# Patient Record
Sex: Female | Born: 1970 | Race: Asian | Hispanic: No | Marital: Single
Health system: Midwestern US, Community
[De-identification: ages and names within clinical notes are randomized; demographics above are authoritative.]

## PROBLEM LIST (undated history)

## (undated) DIAGNOSIS — N920 Excessive and frequent menstruation with regular cycle: Secondary | ICD-10-CM

## (undated) DIAGNOSIS — E785 Hyperlipidemia, unspecified: Secondary | ICD-10-CM

## (undated) DIAGNOSIS — T7840XA Allergy, unspecified, initial encounter: Secondary | ICD-10-CM

## (undated) DIAGNOSIS — D649 Anemia, unspecified: Secondary | ICD-10-CM

## (undated) DIAGNOSIS — E079 Disorder of thyroid, unspecified: Secondary | ICD-10-CM

## (undated) DIAGNOSIS — Z973 Presence of spectacles and contact lenses: Secondary | ICD-10-CM

## (undated) DIAGNOSIS — E119 Type 2 diabetes mellitus without complications: Secondary | ICD-10-CM

## (undated) DIAGNOSIS — Z9109 Other allergy status, other than to drugs and biological substances: Secondary | ICD-10-CM

## (undated) DIAGNOSIS — R002 Palpitations: Secondary | ICD-10-CM

## (undated) DIAGNOSIS — R42 Dizziness and giddiness: Secondary | ICD-10-CM

## (undated) DIAGNOSIS — D509 Iron deficiency anemia, unspecified: Secondary | ICD-10-CM

## (undated) DIAGNOSIS — N840 Polyp of corpus uteri: Secondary | ICD-10-CM

## (undated) DIAGNOSIS — I10 Essential (primary) hypertension: Secondary | ICD-10-CM

## (undated) DIAGNOSIS — K769 Liver disease, unspecified: Secondary | ICD-10-CM

## (undated) HISTORY — DX: Allergy, unspecified, initial encounter: T78.40XA

## (undated) HISTORY — DX: Hyperlipidemia, unspecified: E78.5

## (undated) HISTORY — PX: DILATION AND CURETTAGE OF UTERUS: SHX78

## (undated) HISTORY — DX: Excessive and frequent menstruation with regular cycle: N92.0

---

## 2007-08-19 DIAGNOSIS — E0500 Thyrotoxicosis with diffuse goiter without thyrotoxic crisis or storm: Secondary | ICD-10-CM

## 2007-08-19 HISTORY — DX: Thyrotoxicosis with diffuse goiter without thyrotoxic crisis or storm: E05.00

## 2013-08-18 DIAGNOSIS — Z9289 Personal history of other medical treatment: Secondary | ICD-10-CM

## 2013-08-18 HISTORY — DX: Personal history of other medical treatment: Z92.89

## 2014-08-18 HISTORY — PX: HYSTEROSCOPY WITH D & C: SHX1775

## 2015-11-20 LAB — AMB EXT LIPID PANEL
HDL, External: 73
LDL-C, External: 90
Total Cholesterol, External: 176
Triglycerides, External: 66

## 2015-11-20 LAB — AMB EXT HGBA1C: Hemoglobin A1c, External: 6.4

## 2016-06-04 LAB — AMB EXT TSH: TSH, EXTERNAL: 2.28

## 2016-06-04 LAB — AMB EXT HGBA1C: Hemoglobin A1c, External: 6

## 2016-06-04 LAB — AMB EXT HCT: Hct, External: 37.1

## 2016-08-01 NOTE — Telephone Encounter (Signed)
Patient Identity Verified with 2 Methods        Call Type    Admin/Sched   PRIORITY Normal   Initial Call Started: Madison Kelley,  May 20, 2016 9:52 AM   Spoke to: Patient   Rescheduled/Cancelled Appt Comment [facility]. She would also like a TSH to be added to the orders. Please mail order directly to patient.    Initial call taken by: Madison Kelley,  May 20, 2016 9:52 AM         Follow-up for Phone Call    Follow-up Details: OK to add TSH order?   Follow-up by: Adam PhenixKimberly Durrell CMA AAMA,  May 20, 2016 9:53 AM      Additional Follow-up for Phone Call    Additional Follow-up Details: mailed   Additional Follow-up by: Adam PhenixKimberly Durrell CMA AAMA,  May 20, 2016 12:12 PM         New Orders:   TSH [[id number]]         Electronically signed by Adam PhenixKimberly Durrell CMA AAMA on 05/20/2016 at 12:12 PM   ________________________________________________________________________

## 2016-08-01 NOTE — Telephone Encounter (Signed)
Call Type    Referrals   PRIORITY Normal   Coordination of Care:       Coordination of Care: Referral made for PT on 10/11/14.  Following note was left w/order:       rec'd fax from [facility] "pt has not returned our call to schedule" the 11/02/14 office visit says thew pt may be out of the country [place] months. Rodman CompBeyenberg, Amanda  November 06, 2014 8:16 AM    Ref Started:October 16, 2014       This order is over a year old.  Is it still to be pursued?   Initial call taken by: Marlowe AltSherri Cochran,  June 03, 2016 3:23 PM         Follow-up for Phone Call    Follow-up Details: ok to cancel   Follow-up by: Zorita PangKathy L Bouton Semmel, PA-C,  June 03, 2016 5:40 PM            Electronically signed by Marlowe AltSherri Cochran on 06/04/2016 at 8:04 AM   ________________________________________________________________________

## 2016-09-30 MED ORDER — METFORMIN ER 500 MG 24 HR TABLET,EXTENDED RELEASE
500 mg | ORAL_TABLET | Freq: Two times a day (BID) | ORAL | 0 refills | Status: DC
Start: 2016-09-30 — End: 2017-03-16

## 2016-09-30 NOTE — Telephone Encounter (Signed)
Patient in office requesting refill of Metformin. She has 2 months worth but needs one more month because [place] is going to [place]. She uses [facility]. She leaves on 10/14/16.

## 2016-09-30 NOTE — Telephone Encounter (Signed)
Madison Kelley, ok to do?

## 2016-09-30 NOTE — Telephone Encounter (Signed)
Yes and please do a 90 day supply- double check dose in centricity

## 2017-03-16 ENCOUNTER — Telehealth

## 2017-03-16 MED ORDER — METFORMIN SR 500 MG 24 HR TABLET
500 mg | ORAL_TABLET | Freq: Two times a day (BID) | ORAL | 0 refills | Status: DC
Start: 2017-03-16 — End: 2017-06-09

## 2017-03-16 NOTE — Telephone Encounter (Signed)
Madison Kelley is moving out of [place] on Aug 23 and was looking to get her PE done before she moves.  These are her days off.Marland Kitchen.Marland Kitchen.Marland Kitchen.Aug 1 and 2 she is off work and the 6th, 7, 9th and 10, 14 - 16 are days she is off and could come in for a PE. She also wants labs done and would like to come in and pick up the orders tomorrow or soon after.Please give her a call when they are ready to be picked up    I was unable to schedule due to space limits on scheduling PEs.  Please advise.     She also needs a refill on her Metformin ER.  She would like to get a 90 day supply.

## 2017-03-16 NOTE — Telephone Encounter (Signed)
I have filled the metformin.     Please advise on annual PE and lab orders. Is it OK to use a non- physical spot?

## 2017-03-17 NOTE — Telephone Encounter (Signed)
He has any spot please-CBC diagnosis iron anemia, CMP lipids hemoglobin A1c urine micro diagnosis type 2 diabetes-TSH can also be used for type 2 diabetes

## 2017-03-18 ENCOUNTER — Encounter

## 2017-03-18 LAB — HM DIABETES EYE EXAM

## 2017-03-18 NOTE — Telephone Encounter (Signed)
Victorino DikeJennifer spoke with pt. And scheduled her for PE. [facility] is [facility]. She is coming to pick up orders, they have been printed.

## 2017-03-18 NOTE — Telephone Encounter (Signed)
I added lab order, however I don't know which lab she's planning on going to. [facility] to [facility]. I left her a detailed message asking for a call back to schedule PE.

## 2017-03-18 NOTE — Telephone Encounter (Signed)
Spoke to patient and scheduled her for 8/10. She is going to stop by the office and pick up labs today or tomorrow. [facility].

## 2017-03-19 LAB — DIFFERENTIAL, AUTO.
ABS. BASOPHILS: 0.09 10*3/uL (ref 0.00–0.20)
ABS. BASOPHILS: 0.09 10*3/uL (ref 0.00–0.20)
ABS. IMM. GRANS.: 0.01 10*3/uL (ref 0.00–0.03)
ABS. IMM. GRANS.: 0.01 10*3/uL (ref 0.00–0.03)
ABS. MONOCYTES: 0.3 10*3/uL (ref 0.10–0.80)
ABS. MONOCYTES: 0.3 10*3/uL (ref 0.10–0.80)
ABS. NEUTROPHILS: 3.19 10*3/uL (ref 1.90–7.80)
ABS. NEUTROPHILS: 3.19 10*3/uL (ref 1.90–7.80)
Abs Lymphocytes: 1.56 10*3/uL (ref 1.00–4.50)
Abs Lymphocytes: 1.56 10*3/uL (ref 1.00–4.50)
BASOPHILS: 1.7 %
BASOPHILS: 1.7 %
BRCH EOSINS: 1.2 %
BRCH EOSINS: 1.2 %
BRCH NEUTROPHIL: 61.2 %
BRCH NEUTROPHIL: 61.2 %
Eos abs-DIF: 0.06 10*3/uL (ref 0.00–0.50)
Eos abs-DIF: 0.06 10*3/uL (ref 0.00–0.50)
IMMATURE GRANULOCYTES: 0.2 %
IMMATURE GRANULOCYTES: 0.2 %
LYMPHOCYTES: 29.9 %
LYMPHOCYTES: 29.9 %
MONOCYTES: 5.8 %
MONOCYTES: 5.8 %

## 2017-03-19 LAB — CBC WITH AUTOMATED DIFF
HCT: 34.2 % — ABNORMAL LOW (ref 36.0–47.0)
HGB: 10.6 g/dL — ABNORMAL LOW (ref 12.0–16.0)
MCH: 24.9 pg — ABNORMAL LOW (ref 28.0–34.0)
MCHC: 31 g/dL — ABNORMAL LOW (ref 32.0–36.0)
MCV: 80.5 fL (ref 80.0–100.0)
MEAN PLATELET VOLUME: 9 fL (ref 8.5–12.0)
PLATELET: 471 10*3/uL — ABNORMAL HIGH (ref 150–400)
RBC: 4.25 Mil/uL (ref 4.00–5.40)
RDW-CV: 14.8 % — ABNORMAL HIGH (ref 11.5–13.6)
RDW-SD: 43.3 fL (ref 35.0–47.0)
WBC: 5.2 10*3/uL (ref 4.7–10.8)

## 2017-03-19 LAB — MICROALBUMIN, UR, RAND
CREATININE UR: 0.49 g/L
Creatinine conc., random urine: 49.3 mg/dL
Microalbumin urine, mg/L: <12 mg/L (ref 0.0–19.9)

## 2017-03-19 LAB — METABOLIC PANEL, COMPREHENSIVE
ALT (SGPT): 9 IU/L (ref 0–33)
AST (SGOT): 15 IU/L (ref 0–32)
Albumin: 4.3 g/dL (ref 3.5–5.2)
Alk. phosphatase: 55 IU/L (ref 35–104)
Anion gap: 12 mEq/L (ref 3–16)
BUN: 12 mg/dL (ref 6–20)
Bilirubin, total: 0.4 mg/dL (ref 0.0–1.0)
CO2: 27 mEq/L (ref 22–32)
Calcium: 9.1 mg/dL (ref 8.8–10.3)
Chloride: 103 mEq/L (ref 98–107)
Creatinine: 0.63 mg/dL (ref 0.40–1.10)
Glucose: 95 mg/dL (ref 70–120)
Potassium: 4.2 mEq/L (ref 3.5–5.0)
Protein, total: 6.9 g/dL (ref 6.1–7.9)
Sodium: 142 mEq/L (ref 136–145)
eGFR: >60

## 2017-03-19 LAB — LIPID PANEL W/ REFLX DIRECT LDL
Cholesterol, total: 160 mg/dL — ABNORMAL LOW (ref 165–199)
HDL Cholesterol: 70 mg/dL (ref 40–91)
LDL CHOL, CALCULATED: 78 mg/dL (ref 70–129)
LDL+VLDL: 90 mg/dL (ref <=160)
Triglyceride: 63 mg/dL (ref 50–149)

## 2017-03-19 LAB — HEMOGLOBIN A1C WITH EAG
Est. average glucose: 128 mg/dL
Hemoglobin A1c: 6.1 % — ABNORMAL HIGH (ref 4.8–5.6)

## 2017-03-19 LAB — TSH REFLEX TO T4F
TSH: 0.57 u[IU]/mL (ref 0.40–3.80)
TSH: 0.57 u[IU]/mL (ref 0.40–3.80)

## 2017-03-19 LAB — HEPATIC FUNCTION PANEL
ALK PHOS: 55 (ref 25–125)
ALT: 9 (ref 7–35)
AST: 15 (ref 13–35)
Bilirubin, Total: 0.4

## 2017-03-19 LAB — BASIC METABOLIC PANEL
BUN: 12 (ref 4–21)
Creatinine: 0.6 (ref 0.5–1.1)
GLUCOSE: 95
Potassium: 4.2 (ref 3.4–5.3)
Sodium: 142 (ref 137–147)

## 2017-03-19 LAB — TSH: TSH: 0.57 (ref 0.41–5.90)

## 2017-03-19 LAB — LIPID PANEL
Cholesterol: 160 (ref 0–200)
HDL: 70 (ref 35–70)
LDL CALC: 78
Triglycerides: 63 (ref 40–160)

## 2017-03-24 ENCOUNTER — Encounter

## 2017-03-27 ENCOUNTER — Ambulatory Visit: Admit: 2017-03-27 | Discharge: 2017-03-27 | Payer: PRIVATE HEALTH INSURANCE | Attending: Physician Assistant

## 2017-03-27 DIAGNOSIS — Z0000 Encounter for general adult medical examination without abnormal findings: Secondary | ICD-10-CM

## 2017-03-27 NOTE — Patient Instructions (Signed)
Your numbers are good, except your iron is low- please restart the ferrous sulfate , take daily with a small glass of orange juice

## 2017-03-27 NOTE — Progress Notes (Signed)
[facility]   [place] [id number]  [phone]    CHIEF COMPLAINT   Madison Kelley is a 46 y.o. female who presents to clinic for Physical.    HPI   1.  Annual physical-[place] family there.  Works as a Marine scientist, nonsmoker, no regular exercise. Single, G0P0.  Will be moving in a few weeks to [place], with the family that she has been living with in friends with for years.  She feels she will have no trouble finding a job there.  She does see Dr. in [place] for her gyn care, she did have a D&C in the fall of 2016.  She has a history of anemia, the and is no longer on her iron.   She is current with her Tdap and gets annual flu shots.  She does have type 2 diabetes and takes metformin.  She has a history of Graves disease, still follows with Endocrine      Patient Active Problem List   Diagnosis Code   ??? 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 Anemia, iron deficiency D50.9   ??? Neck pain M54.2   ??? 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 Diabetes mellitus, type II ([place]) E11.9   ??? Intermittent chest pain R07.9   ??? Vitamin D deficiency E55.9   ??? Vertigo R42   ??? Graves' disease E05.00   ??? Annual physical exam Z00.00     Past Medical History:   Diagnosis Date   ??? Diabetes ([place]) 2008    Type 2 no complications   ??? History of prior pregnancies     G0P0 periods 4 per year OBGYN ok with this- Dr. Bridgette Habermann   ??? Hyperthyroidism      Past Surgical History:   Procedure Laterality Date   ??? HX DILATION AND CURETTAGE  2016    Fall 2016     Family History   Problem Relation Age of Onset   ??? Diabetes Mother      Type 2    ??? Cancer Mother      Cervical   ??? Coronary Artery Disease Mother    ??? Dementia Father      Healthy, age 41   ??? Diabetes Brother      Type 2   ??? Thyroid Cancer Maternal Aunt    ??? Diabetes Maternal Aunt    ??? Other Maternal Aunt      Thyroid Disease ?   ??? Diabetes Brother      Type 2     Social History     Social History   ??? Marital status: SINGLE     Spouse name: N/A    ??? Number of children: N/A   ??? Years of education: N/A     Occupational History   ??? Not on file.     Social History Main Topics   ??? Smoking status: Never Smoker   ??? Smokeless tobacco: Never Used   ??? Alcohol use Not on file   ??? Drug use: Not on file   ??? Sexual activity: Not on file     Other Topics Concern   ??? Not on file     Social History Narrative    Single    No children    [employer]-nurse-works as a float-nights    Back in school to get her masters    Patient has never smoked.    Regular Exercise-no  No Known Allergies    Current Outpatient Prescriptions on File Prior to Visit   Medication Sig Dispense Refill   ??? FREESTYLE LANCETS daily. Freestyle Lancets Misc. Lite: For daily testing DX: E11.9     ??? blood-glucose meter (FREESTYLE LITE METER) daily. Use Daily as directed DX E11.9     ??? glucose blood VI test strips (FREESTYLE LITE STRIPS) strip daily. Test daily. DX E11.9     ??? metFORMIN ER (GLUCOPHAGE XR) 500 mg tablet Take 1 Tab by mouth two (2) times a day. 180 Tab 0     No current facility-administered medications on file prior to visit.      There are no discontinued medications.          REVIEW OF SYSTEMS   Review of Systems   Constitutional: Negative for chills and fever.   HENT: Negative for congestion and sore throat.    Eyes: Negative for blurred vision and double vision.   Respiratory: Negative for cough, shortness of breath and wheezing.    Cardiovascular: Negative for chest pain and palpitations.   Gastrointestinal: Negative for blood in stool, constipation, heartburn, nausea and vomiting.   Genitourinary: Negative for dysuria.        See HPI   Musculoskeletal: Negative for back pain and neck pain.   Skin: Negative for rash.   Neurological: Negative for dizziness and headaches.   Endo/Heme/Allergies: Does not bruise/bleed easily.   Psychiatric/Behavioral: Negative for depression. The patient is not nervous/anxious and does not have insomnia.        PHYSICAL EXAM     Visit Vitals    ??? BP 120/82 (BP 1 Location: Right arm, BP Patient Position: Sitting)   ??? Pulse 72   ??? Temp 98.3 ??F (36.8 ??C) (Oral)   ??? Ht 5' 3.19" (1.605 m)   ??? Wt 130 lb 3.2 oz (59.1 kg)   ??? BMI 22.93 kg/m2       Physical Exam   Constitutional: She is oriented to person, place, and time and well-developed, well-nourished, and in no distress.   HENT:   Head: Normocephalic.   Right Ear: External ear normal.   Left Ear: External ear normal.   Mouth/Throat: Oropharynx is clear and moist.   Glasses   Eyes: Conjunctivae and EOM are normal. Pupils are equal, round, and reactive to light.   Neck: Neck supple. No thyromegaly present.   Cardiovascular: Normal rate, regular rhythm, normal heart sounds and intact distal pulses.    Pulmonary/Chest: Effort normal and breath sounds normal.   Abdominal: Bowel sounds are normal. She exhibits no distension. There is no tenderness.   Musculoskeletal: Normal range of motion. She exhibits no edema or deformity.   Lymphadenopathy:     She has no cervical adenopathy.   Neurological: She is alert and oriented to person, place, and time. She has normal reflexes.   Psychiatric: Memory and affect normal.   Vitals reviewed.      LABS/IMAGING     No results found for any visits on 03/27/17.  Lab Results   Component Value Date/Time    Sodium 142 03/19/2017 09:42 AM    Potassium 4.2 03/19/2017 09:42 AM    Chloride 103 03/19/2017 09:42 AM    CO2 27 03/19/2017 09:42 AM    Anion gap 12 03/19/2017 09:42 AM    Glucose 95 03/19/2017 09:42 AM    BUN 12 03/19/2017 09:42 AM    Creatinine 0.63 03/19/2017 09:42 AM    Calcium 9.1 03/19/2017 09:42  AM    Bilirubin, total 0.4 03/19/2017 09:42 AM    AST (SGOT) 15 03/19/2017 09:42 AM    Alk. phosphatase 55 03/19/2017 09:42 AM    Protein, total 6.9 03/19/2017 09:42 AM    Albumin 4.3 03/19/2017 09:42 AM    ALT (SGPT) 9 03/19/2017 09:42 AM     Lab Results   Component Value Date/Time    WBC 5.2 03/19/2017 09:42 AM    HGB 10.6 (L) 03/19/2017 09:42 AM     HCT 34.2 (L) 03/19/2017 09:42 AM    PLATELET 471 (H) 03/19/2017 09:42 AM    MCV 80.5 03/19/2017 09:42 AM    Hct, External 37.1 06/04/2016     Lab Results   Component Value Date/Time    Hemoglobin A1c 6.1 (H) 03/19/2017 09:42 AM    Hemoglobin A1c, External 6.0 06/04/2016     Lab Results   Component Value Date/Time    TSH 0.57 03/19/2017 09:42 AM    Tsh, External 2.28 06/04/2016     Lab Results   Component Value Date/Time    Cholesterol, total 160 (L) 03/19/2017 09:42 AM    HDL Cholesterol 70 03/19/2017 09:42 AM    LDL CHOL, CALCULATED 78 03/19/2017 09:42 AM    Triglyceride 63 03/19/2017 09:42 AM             PREVENTIVE CARE     Immunization History   Administered Date(s) Administered   ??? Influenza Vaccine 06/20/2015   ??? Td 02/08/1998   ??? Tdap 03/29/2013   ??? Zoster Vaccine, Live 02/26/2004               ASSESSMENT'PLAN   Diagnoses and all orders for this visit:    1. Annual physical exam-no change in meds-encouraged patient to start an exercise regimen, even if it is walking.  The she also will restart her iron supplementation, remembering to take it with a glass of orange juice daily.  She will find a PCP when she was [place] and make sure she has a follow-up down there.            Follow-up Disposition:  Return pt moving this month to [place].  No future appointments.    [place], PA-C  03/27/2017

## 2017-06-09 MED ORDER — METFORMIN SR 500 MG 24 HR TABLET
500 mg | ORAL_TABLET | Freq: Two times a day (BID) | ORAL | 0 refills | Status: DC
Start: 2017-06-09 — End: 2017-09-28

## 2017-06-09 NOTE — Telephone Encounter (Signed)
RX sent to pharmacy she requested

## 2017-06-09 NOTE — Telephone Encounter (Signed)
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

## 2017-06-09 NOTE — Telephone Encounter (Signed)
Rcv'd a voicemail from pt. Requesting a refill on metformin 500 mg er 1 po [facility]     Pt. Has moved there, but I don't see a ROI in pt's chart showing she found a new PCP yet? Is it OK to fill or?

## 2017-07-29 ENCOUNTER — Encounter (HOSPITAL_COMMUNITY): Payer: Self-pay

## 2017-07-29 ENCOUNTER — Emergency Department (HOSPITAL_COMMUNITY)
Admission: EM | Admit: 2017-07-29 | Discharge: 2017-07-29 | Disposition: A | Discharge status: Home / Self Care | Payer: 59 | Attending: Emergency Medicine | Admitting: Emergency Medicine

## 2017-07-29 ENCOUNTER — Other Ambulatory Visit: Payer: Self-pay

## 2017-07-29 DIAGNOSIS — E119 Type 2 diabetes mellitus without complications: Secondary | ICD-10-CM | POA: Insufficient documentation

## 2017-07-29 DIAGNOSIS — R9431 Abnormal electrocardiogram [ECG] [EKG]: Secondary | ICD-10-CM | POA: Diagnosis not present

## 2017-07-29 DIAGNOSIS — R42 Dizziness and giddiness: Secondary | ICD-10-CM | POA: Insufficient documentation

## 2017-07-29 DIAGNOSIS — Z7984 Long term (current) use of oral hypoglycemic drugs: Secondary | ICD-10-CM | POA: Insufficient documentation

## 2017-07-29 HISTORY — DX: Anemia, unspecified: D64.9

## 2017-07-29 HISTORY — DX: Disorder of thyroid, unspecified: E07.9

## 2017-07-29 HISTORY — DX: Type 2 diabetes mellitus without complications: E11.9

## 2017-07-29 LAB — BASIC METABOLIC PANEL
Anion gap: 8 (ref 5–15)
BUN: 7 mg/dL (ref 6–20)
CO2: 24 mmol/L (ref 22–32)
CREATININE: 0.54 mg/dL (ref 0.44–1.00)
Calcium: 8.6 mg/dL — ABNORMAL LOW (ref 8.9–10.3)
Chloride: 102 mmol/L (ref 101–111)
GFR calc Af Amer: >60 mL/min (ref >60)
GFR calc non Af Amer: >60 mL/min (ref >60)
GLUCOSE: 141 mg/dL — AB (ref 65–99)
Potassium: 3.6 mmol/L (ref 3.5–5.1)
SODIUM: 134 mmol/L — AB (ref 135–145)

## 2017-07-29 LAB — CBG MONITORING, ED: GLUCOSE-CAPILLARY: 134 mg/dL — AB (ref 65–99)

## 2017-07-29 LAB — URINALYSIS, ROUTINE W REFLEX MICROSCOPIC
BILIRUBIN URINE: NEGATIVE
Glucose, UA: NEGATIVE mg/dL
Hgb urine dipstick: NEGATIVE
KETONES UR: 20 mg/dL — AB
Nitrite: NEGATIVE
Protein, ur: NEGATIVE mg/dL
SPECIFIC GRAVITY, URINE: 1.014 (ref 1.005–1.030)
pH: 9 — ABNORMAL HIGH (ref 5.0–8.0)

## 2017-07-29 LAB — CBC
HEMATOCRIT: 36.1 % (ref 36.0–46.0)
Hemoglobin: 11.4 g/dL — ABNORMAL LOW (ref 12.0–15.0)
MCH: 24.3 pg — AB (ref 26.0–34.0)
MCHC: 31.6 g/dL (ref 30.0–36.0)
MCV: 77 fL — AB (ref 78.0–100.0)
Platelets: 459 10*3/uL — ABNORMAL HIGH (ref 150–400)
RBC: 4.69 MIL/uL (ref 3.87–5.11)
RDW: 15.7 % — AB (ref 11.5–15.5)
WBC: 10.5 10*3/uL (ref 4.0–10.5)

## 2017-07-29 LAB — I-STAT BETA HCG BLOOD, ED (MC, WL, AP ONLY)

## 2017-07-29 MED ORDER — MECLIZINE HCL 25 MG PO TABS: 25.0000 mg | ORAL_TABLET | Freq: Three times a day (TID) | ORAL | 0 refills | Status: DC | PRN

## 2017-07-29 NOTE — Discharge Instructions (Signed)
If you do not have a primary care physician then it is very important that you develop a relationship with one.  Please contact [facility] at [phone] for a referral to many excellent primary care physicians in the community.   ° ° °

## 2017-07-29 NOTE — ED Triage Notes (Signed)
Pt states she woke up this morning with dizziness. She states hx of vertigo. Pt reports dizziness is worse with moving her head. Dizziness has also caused nausea and 3 episodes of emesis.

## 2017-07-29 NOTE — ED Notes (Signed)
Pt understood dc material. NAD noted. Script given at dc  

## 2017-07-29 NOTE — ED Notes (Signed)
Patient refusing cardiac monitoring at this time.

## 2017-07-29 NOTE — ED Triage Notes (Signed)
Patient reports feeling better and not wanting to wait anymore. RN encouraged patient to stay, patient agrees to stay so she can get her results.

## 2017-07-29 NOTE — ED Notes (Signed)
ED Provider at bedside. 

## 2017-07-29 NOTE — ED Provider Notes (Signed)
[facility] EMERGENCY DEPARTMENT Provider Note  CSN: [id number] Arrival date & time: 07/29/17 1513  Chief Complaint(s) Dizziness  HPI Kaitlin Bowman is a 46 y.o. 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  This was a bit different than her previous vertiginous symptoms, stating that before the symptoms were more severe.  She did report that her symptoms worsen with head movement.  Improved with tilting her head to the right.  She did have associated nausea and vomiting vertiginous symptoms.  Denies any recent fevers, infections, nasal congestion.  No head trauma.  No chest pain or shortness of breath.  No focal weakness.  Symptoms gradually improved over the past several hours and have now resolved.  HPI  Past Medical History Past Medical History:  Diagnosis Date  . Anemia   . Diabetes mellitus without complication (HCC)   . Thyroid disease    There are no active problems to display for this patient.  Home Medication(s) Prior to Admission medications   Medication Sig Start Date End Date Taking? Authorizing Provider  aspirin-acetaminophen-caffeine (EXCEDRIN MIGRAINE) [phone] MG tablet Take 1 tablet by mouth every 6 (six) hours as needed (for migraines).    Yes [provider]  metFORMIN (GLUCOPHAGE-XR) 500 MG 24 hr tablet Take 500 mg by mouth 2 (two) times daily with a meal.   Yes [provider]  meclizine (ANTIVERT) 25 MG tablet Take 1 tablet (25 mg total) by mouth 3 (three) times daily as needed for dizziness. 07/29/17   Nira Conn, MD                                                                                                                                    Past Surgical History History reviewed. No pertinent surgical history. Family History History reviewed. No pertinent family history.  Social History Social History   Tobacco Use  . Smoking status: Never  Smoker  . Smokeless tobacco: Never Used  Substance Use Topics  . Alcohol use: No    Frequency: Never  . Drug use: No   Allergies Mango flavor [flavoring agent] and Other  Review of Systems Review of Systems All other systems are reviewed and are negative for acute change except as noted in the HPI  Physical Exam Vital Signs  I have reviewed the triage vital signs BP 129/71 (BP Location: Right Arm)   Pulse 74   Temp 98.6 F (37 C)   Resp 16   SpO2 100%   Physical Exam  Constitutional: She is oriented to person, place, and time. She appears well-developed and well-nourished. No distress.  HENT:  Head: Normocephalic and atraumatic.  Right Ear: Tympanic membrane and external ear normal.  Left Ear: Tympanic membrane and external ear normal.  Nose: Nose normal.  Eyes: Conjunctivae and EOM are normal. No scleral icterus.  Neck: Normal range of motion and  phonation normal.  Cardiovascular: Normal rate and regular rhythm.  Pulmonary/Chest: Effort normal. No stridor. No respiratory distress.  Abdominal: She exhibits no distension.  Musculoskeletal: Normal range of motion. She exhibits no edema.  Neurological: She is alert and oriented to person, place, and time.  Mental Status:  Alert and oriented to person, place, and time.  Attention and concentration normal.  Speech clear.  Recent memory is intact  Cranial Nerves:  II Visual Fields: Intact to confrontation. Visual fields intact. III, IV, VI: Pupils equal and reactive to light and near. Full eye movement without nystagmus  V Facial Sensation: Normal. No weakness of masticatory muscles  VII: No facial weakness or asymmetry  VIII Auditory Acuity: Grossly normal  IX/X: The uvula is midline; the palate elevates symmetrically  XI: Normal sternocleidomastoid and trapezius strength  XII: The tongue is midline. No atrophy or fasciculations.   Motor System: Muscle Strength: 5/5 and symmetric in the upper and lower extremities. No  pronation or drift.  Muscle Tone: Tone and muscle bulk are normal in the upper and lower extremities.   Reflexes: DTRs: 1+ and symmetrical in all four extremities. No Clonus Coordination: Intact finger-to-nose, heel-to-shin. No tremor.  Sensation: Intact to light touch, and pinprick. Negative Romberg test.  Gait: Routine gait normal.   Skin: She is not diaphoretic.  Psychiatric: She has a normal mood and affect. Her behavior is normal.  Vitals reviewed.   ED Results and Treatments Labs (all labs ordered are listed, but only abnormal results are displayed) Labs Reviewed  BASIC METABOLIC PANEL - Abnormal; Notable for the following components:      Result Value   Sodium 134 (*)    Glucose, Bld 141 (*)    Calcium 8.6 (*)    All other components within normal limits  CBC - Abnormal; Notable for the following components:   Hemoglobin 11.4 (*)    MCV 77.0 (*)    MCH 24.3 (*)    RDW 15.7 (*)    Platelets 459 (*)    All other components within normal limits  URINALYSIS, ROUTINE W REFLEX MICROSCOPIC - Abnormal; Notable for the following components:   pH 9.0 (*)    Ketones, ur 20 (*)    Leukocytes, UA SMALL (*)    Bacteria, UA RARE (*)    Squamous Epithelial / LPF 0-5 (*)    All other components within normal limits  CBG MONITORING, ED - Abnormal; Notable for the following components:   Glucose-Capillary 134 (*)    All other components within normal limits  CBG MONITORING, ED  I-STAT BETA HCG BLOOD, ED ([facility], [facility], [facility] ONLY)                                                                                                                         EKG  EKG Interpretation  Date/Time:  Wednesday July 29 2017 15:26:24 EST Ventricular Rate:  79 PR Interval:  152 QRS Duration: 72 QT Interval:  384 QTC  Calculation: 440 R Axis:   114 Text Interpretation:   Suspect arm lead reversal, interpretation assumes no reversal Normal sinus rhythm Lateral infarct , age undetermined Possible Inferior  infarct , age undetermined Abnormal ECG Confirmed by Margarita Grizzleay, Danielle [phone]([id number]) on 07/29/2017 3:49:53 PM      EKG Interpretation  Date/Time:  Wednesday July 29 2017 20:30:32 EST Ventricular Rate:  88 PR Interval:  156 QRS Duration: 64 QT Interval:  368 QTC Calculation: 445    Text Interpretation: Normal sinus rhythm.  Nonspecific T wave changes.  No dysrhythmias, blocks.  Radiology No results found. Pertinent labs & imaging results that were available during my care of the patient were reviewed by me and considered in my medical decision making (see chart for details).  Medications Ordered in ED Medications - No data to display                                                                                                                                  Procedures Procedures  (including critical care time)  Medical Decision Making / ED Course I have reviewed the nursing notes for this encounter and the patient's prior records (if available in EHR or on provided paperwork).    Patient presents with vertiginous symptoms.  Nonfocal exam.  Presentation is not concerning for central process.  She is currently asymptomatic.  Initial EKG concerning for lead reversal.  Repeat EKG without acute ischemic changes, dysrhythmias, blocks.  Patient does have a history of anemia but her hemoglobin is at baseline.  She denies any acute bleeds.  The patient appears reasonably screened and/or stabilized for discharge and I doubt any other medical condition or other [facility] requiring further screening, evaluation, or treatment in the ED at this time prior to discharge.  The patient is safe for discharge with strict return precautions.   Final Clinical Impression(s) / ED Diagnoses Final diagnoses:  Vertigo   Disposition: Discharge  Condition: Good  I have discussed the results, Dx and Tx plan with the patient who expressed understanding and agree(s) with the plan. Discharge instructions  discussed at great length. The patient was given strict return precautions who verbalized understanding of the instructions. No further questions at time of discharge.    ED Discharge Orders        Ordered    meclizine (ANTIVERT) 25 MG tablet  3 times daily PRN     07/29/17 2052       This chart was dictated using voice recognition software.  Despite best efforts to proofread,  errors can occur which can change the documentation meaning.   Nira Connardama, Pedro Eduardo, MD 07/29/17 [phone]

## 2017-09-28 MED ORDER — METFORMIN SR 500 MG 24 HR TABLET
500 mg | ORAL_TABLET | Freq: Two times a day (BID) | ORAL | 0 refills | Status: AC
Start: 2017-09-28 — End: ?

## 2017-09-28 NOTE — Telephone Encounter (Signed)
Call back needed: no  Medication(s): metformin  Quantity: 90 days                                         [facility]  Prescriber:   Melissa MontaneBouton semmel  Last appt @ PCP Office:  No future appointments.    MOST RECENT BLOOD PRESSURES  BP Readings from Last 3 Encounters:   03/27/17 120/82   03/07/15 114/73        MOST RECENT LAB DATA  Lab Results   Component Value Date/Time    Creatinine 0.63 03/19/2017 09:42 AM    Potassium 4.2 03/19/2017 09:42 AM    ALT (SGPT) 9 03/19/2017 09:42 AM    TSH 0.57 03/19/2017 09:42 AM    TSH 0.57 03/19/2017 09:42 AM    Tsh, External 2.28 06/04/2016    Cholesterol, total 160 (L) 03/19/2017 09:42 AM    HGB 10.6 (L) 03/19/2017 09:42 AM    HCT 34.2 (L) 03/19/2017 09:42 AM    Hct, External 37.1 06/04/2016    Hemoglobin A1c 6.1 (H) 03/19/2017 09:42 AM    Hemoglobin A1c, External 6.0 06/04/2016

## 2017-12-01 ENCOUNTER — Ambulatory Visit (INDEPENDENT_AMBULATORY_CARE_PROVIDER_SITE_OTHER): Payer: 59 | Admitting: Nurse Practitioner

## 2017-12-01 ENCOUNTER — Ambulatory Visit: Payer: 59 | Admitting: Nurse Practitioner

## 2017-12-01 ENCOUNTER — Encounter: Payer: Self-pay | Admitting: Nurse Practitioner

## 2017-12-01 VITALS — BP 128/88 | HR 78 | Temp 97.9°F | Ht 63.25 in | Wt 134.8 lb

## 2017-12-01 DIAGNOSIS — R197 Diarrhea, unspecified: Secondary | ICD-10-CM

## 2017-12-01 DIAGNOSIS — E119 Type 2 diabetes mellitus without complications: Secondary | ICD-10-CM

## 2017-12-01 DIAGNOSIS — Z1231 Encounter for screening mammogram for malignant neoplasm of breast: Secondary | ICD-10-CM

## 2017-12-01 DIAGNOSIS — E079 Disorder of thyroid, unspecified: Secondary | ICD-10-CM | POA: Diagnosis not present

## 2017-12-01 LAB — T4, FREE: FREE T4: 0.77 ng/dL (ref 0.60–1.60)

## 2017-12-01 LAB — TSH: TSH: 1.63 u[IU]/mL (ref 0.35–4.50)

## 2017-12-01 LAB — BASIC METABOLIC PANEL
BUN: 14 mg/dL (ref 6–23)
CALCIUM: 9.1 mg/dL (ref 8.4–10.5)
CO2: 29 mEq/L (ref 19–32)
Chloride: 104 mEq/L (ref 96–112)
Creatinine, Ser: 0.53 mg/dL (ref 0.40–1.20)
GFR: 131.03 mL/min (ref >60.00)
GLUCOSE: 104 mg/dL — AB (ref 70–99)
Potassium: 4.4 mEq/L (ref 3.5–5.1)
SODIUM: 138 meq/L (ref 135–145)

## 2017-12-01 LAB — HEMOGLOBIN A1C: Hgb A1c MFr Bld: 6.9 % — ABNORMAL HIGH (ref 4.6–6.5)

## 2017-12-01 NOTE — Progress Notes (Signed)
Subjective:  Patient ID: Kaitlin Bowman, female    DOB: May 21, 1971  Age: 47 y.o. MRN: [id number]  CC: Establish Care (est care. would like to discuss need for mammogram, has had dense breast tissue in past. Also having string bowel movments with diarrhea.)  Moved from [place] to [facility] 03/2017. Originally from [place].  Diarrhea   This is a new problem. The current episode started yesterday. The problem occurs 2 to 4 times per day. The problem has been unchanged. The stool consistency is described as watery. The patient states that diarrhea does not awaken her from sleep. Associated symptoms include abdominal pain, bloating and increased flatus. Pertinent negatives include no chills, coughing, fever, headaches, myalgias, sweats, URI, vomiting or weight loss. 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 She has tried nothing for the symptoms. Her past medical history is significant for irritable bowel syndrome.   associated with mucus. No blood reports hx of abnormal BMs: alternated from constipation (pebbles) to loose Onset for several years.Worse with low fiber diet and high stress. External hemorrhoids present, not inflammed.  Hx of abnormal mammogram: Last mammogram done in [place], last done 03/2017 (3D done). 6months follow up recommended. Hx of bilateral calcification and dense breast tissue.  has copy of previous mammogram report at home.   Hx of migraine: Use of execdrin prn.  Hx of DM: Diagnosed 2009 Use of metformin. Last hgb A1c 6.4 per patient.  Hyperthyroidism: Diagnosed 2009. Use of metolazone x 3months while in [place]. Thyroid US done in [place] (normal per patient). Diagnosed with Graves' disease.  Irregular Menstrual Cycle: Diagnosed 2009.  Outpatient Medications Prior to Visit  Medication Sig Dispense Refill  . aspirin-acetaminophen-caffeine (EXCEDRIN MIGRAINE) 250-250-65 MG tablet Take 1 tablet by mouth every 6 (six) hours as  needed (for migraines).     . metFORMIN (GLUCOPHAGE-XR) 500 MG 24 hr tablet Take 500 mg by mouth 2 (two) times daily with a meal.    . meclizine (ANTIVERT) 25 MG tablet Take 1 tablet (25 mg total) by mouth 3 (three) times daily as needed for dizziness. (Patient not taking: Reported on 12/01/2017) 30 tablet 0   No facility-administered medications prior to visit.    Past Medical History:  Diagnosis Date  . Anemia   . Diabetes mellitus without complication (HCC)   . Thyroid disease    hyperthyroidism   Social History   Socioeconomic History  . Marital status: Single    Spouse name: Not on file  . Number of children: Not on file  . Years of education: Not on file  . Highest education level: Not on file  Occupational History  . Not on file  Social Needs  . Financial resource strain: Not on file  . Food insecurity:    Worry: Not on file    Inability: Not on file  . Transportation needs:    Medical: Not on file    Non-medical: Not on file  Tobacco Use  . Smoking status: Never Smoker  . Smokeless tobacco: Never Used  Substance and Sexual Activity  . Alcohol use: No    Frequency: Never  . Drug use: No  . Sexual activity: Not Currently  Lifestyle  . Physical activity:    Days per week: Not on file    Minutes per session: Not on file  . Stress: Not on file  Relationships  . Social connections:    Talks on phone: Not on file  Gets together: Not on file    Attends religious service: Not on file    Active member of club or organization: Not on file    Attends meetings of clubs or organizations: Not on file    Relationship status: Not on file  . Intimate partner violence:    Fear of current or ex partner: Not on file    Emotionally abused: Not on file    Physically abused: Not on file    Forced sexual activity: Not on file  Other Topics Concern  . Not on file  Social History Narrative  . Not on file   Family History  Problem Relation Age of Onset  . Diabetes Mother    . Heart disease Mother   . Cancer Mother        cervial  . Heart disease Father   . Dementia Father   . Diabetes Brother   . Cancer Maternal Aunt        Thyroid  . Heart disease Maternal Grandmother   . Anxiety disorder Neg Hx    ROS Review of Systems  Constitutional: Negative for chills, fever, malaise/fatigue and weight loss.  Respiratory: Negative for cough.   Gastrointestinal: Positive for abdominal pain, bloating, diarrhea and flatus. Negative for vomiting.  Musculoskeletal: Negative for joint pain and myalgias.  Skin: Negative.   Neurological: Negative for dizziness and headaches.   Objective:  BP 128/88 (BP Location: Left Arm, Patient Position: Sitting, Cuff Size: Normal)   Pulse 78   Temp 97.9 F (36.6 C) (Oral)   Ht 5' 3.25" (1.607 m)   Wt 134 lb 12.8 oz (61.1 kg)   SpO2 99%   BMI 23.69 kg/m   BP Readings from Last 3 Encounters:  12/01/17 128/88  07/29/17 120/83    Wt Readings from Last 3 Encounters:  12/01/17 134 lb 12.8 oz (61.1 kg)    Physical Exam  Constitutional: She is oriented to person, place, and time. No distress.  Cardiovascular: Normal rate.  Pulmonary/Chest: Effort normal.  Abdominal: Soft. Bowel sounds are normal. She exhibits no distension. There is no tenderness. There is no guarding.  Neurological: She is alert and oriented to person, place, and time.  Psychiatric: She has a normal mood and affect. Her behavior is normal. Judgment and thought content normal.  Vitals reviewed.   Lab Results  Component Value Date   WBC 10.5 07/29/2017   HGB 11.4 (L) 07/29/2017   HCT 36.1 07/29/2017   PLT 459 (H) 07/29/2017   GLUCOSE 104 (H) 12/01/2017   NA 138 12/01/2017   K 4.4 12/01/2017   CL 104 12/01/2017   CREATININE 0.53 12/01/2017   BUN 14 12/01/2017   CO2 29 12/01/2017   TSH 1.63 12/01/2017   HGBA1C 6.9 (H) 12/01/2017    Assessment & Plan:   Kaitlin Bowman was seen today for establish care.  Diagnoses and all orders for this  visit:  Type 2 diabetes mellitus without complication, without long-term current use of insulin (HCC) -     Basic metabolic panel -     Hemoglobin A1c -     metFORMIN (GLUCOPHAGE-XR) 500 MG 24 hr tablet; Take 1 tablet (500 mg total) by mouth 2 (two) times daily with a meal.  Breast cancer screening by mammogram -     MM 3D SCREEN BREAST BILATERAL; Future  Thyroid disorder -     TSH -     T4, free -     Cancel: T3, free  Diarrhea, unspecified  type   I have changed Kaitlin Bowman's metFORMIN. I am also having her maintain her aspirin-acetaminophen-caffeine and meclizine.  Meds ordered this encounter  Medications  . metFORMIN (GLUCOPHAGE-XR) 500 MG 24 hr tablet    Sig: Take 1 tablet (500 mg total) by mouth 2 (two) times daily with a meal.    Dispense:  180 tablet    Refill:  1    Order Specific Question:   Supervising Provider    Answer:   Dianne Dun [[id number]]   Follow-up: Return in about 3 months (around 03/02/2018) for DM and thyroid disorder.  Alysia Penna, NP

## 2017-12-01 NOTE — Patient Instructions (Addendum)
Normal renal function and electrolytes. Normal TSH and T4. hgbA1c 6.9, controlled DM with metformin. Refills sent.  Bring copy of previous mammogram report. Take CD with previous mammogram report to your mammogram appt.  Hold metformin for 2days , then resume Call office if no improvement in diarrhea in 4days. Start probiotic (florastor) 1cap twice a day x 1week.  Diarrhea, Adult Diarrhea is frequent loose and watery bowel movements. Diarrhea can make you feel weak and cause you to become dehydrated. Dehydration can make you tired and thirsty, cause you to have a dry mouth, and decrease how often you urinate. Diarrhea typically lasts 2-3 days. However, it can last longer if it is a sign of something more serious. It is important to treat your diarrhea as told by your health care provider. Follow these instructions at home: Eating and drinking  Follow these recommendations as told by your health care provider:  Take an oral rehydration solution (ORS). This is a drink that is sold at pharmacies and retail stores.  Drink clear fluids, such as water, ice chips, diluted fruit juice, and low-calorie sports drinks.  Eat bland, easy-to-digest foods in small amounts as you are able. These foods include bananas, applesauce, rice, lean meats, toast, and crackers.  Avoid drinking fluids that contain a lot of sugar or caffeine, such as energy drinks, sports drinks, and soda.  Avoid alcohol.  Avoid spicy or fatty foods.  General instructions  Drink enough fluid to keep your urine clear or pale yellow.  Wash your hands often. If soap and water are not available, use hand sanitizer.  Make sure that all people in your household wash their hands well and often.  Take over-the-counter and prescription medicines only as told by your health care provider.  Rest at home while you recover.  Watch your condition for any changes.  Take a warm bath to relieve any burning or pain from frequent  diarrhea episodes.  Keep all follow-up visits as told by your health care provider. This is important. Contact a health care provider if:  You have a fever.  Your diarrhea gets worse.  You have new symptoms.  You cannot keep fluids down.  You feel light-headed or dizzy.  You have a headache  You have muscle cramps. Get help right away if:  You have chest pain.  You feel extremely weak or you faint.  You have bloody or black stools or stools that look like tar.  You have severe pain, cramping, or bloating in your abdomen.  You have trouble breathing or you are breathing very quickly.  Your heart is beating very quickly.  Your skin feels cold and clammy.  You feel confused.  You have signs of dehydration, such as: ? Dark urine, very little urine, or no urine. ? Cracked lips. ? Dry mouth. ? Sunken eyes. ? Sleepiness. ? Weakness. This information is not intended to replace advice given to you by your health care provider. Make sure you discuss any questions you have with your health care provider. Document Released: 07/25/2002 Document Revised: 12/13/2015 Document Reviewed: 04/10/2015 Elsevier Interactive Patient Education  [employer]2018 Elsevier Inc.

## 2017-12-02 ENCOUNTER — Encounter: Payer: Self-pay | Admitting: Nurse Practitioner

## 2017-12-02 MED ORDER — METFORMIN HCL ER 500 MG PO TB24: 500.0000 mg | ORAL_TABLET | Freq: Two times a day (BID) | ORAL | 1 refills | Status: DC

## 2017-12-07 ENCOUNTER — Telehealth: Payer: Self-pay | Admitting: Nurse Practitioner

## 2017-12-07 NOTE — Telephone Encounter (Signed)
Called pt and left VM with phone number for her to call GI breast center to set up her appointment.TLG

## 2017-12-07 NOTE — Telephone Encounter (Signed)
Copied from CRM [phone]. Topic: Quick Communication - See Telephone Encounter >> Dec 07, 2017  1:30 PM Arlyss Gandyichardson, Ishaan Villamar N, NT wrote: CRM for notification. See Telephone encounter for: 12/07/17. Pt states she saw her lab results on MyChart and is calling to set up for her mammogram. Requesting a call back.

## 2017-12-11 ENCOUNTER — Encounter: Payer: Self-pay | Admitting: Nurse Practitioner

## 2017-12-11 DIAGNOSIS — R7611 Nonspecific reaction to tuberculin skin test without active tuberculosis: Secondary | ICD-10-CM | POA: Insufficient documentation

## 2017-12-11 DIAGNOSIS — E049 Nontoxic goiter, unspecified: Secondary | ICD-10-CM | POA: Insufficient documentation

## 2017-12-11 DIAGNOSIS — E0500 Thyrotoxicosis with diffuse goiter without thyrotoxic crisis or storm: Secondary | ICD-10-CM | POA: Insufficient documentation

## 2017-12-11 DIAGNOSIS — D509 Iron deficiency anemia, unspecified: Secondary | ICD-10-CM | POA: Insufficient documentation

## 2017-12-11 DIAGNOSIS — E559 Vitamin D deficiency, unspecified: Secondary | ICD-10-CM | POA: Insufficient documentation

## 2017-12-11 DIAGNOSIS — R42 Dizziness and giddiness: Secondary | ICD-10-CM | POA: Insufficient documentation

## 2017-12-30 ENCOUNTER — Other Ambulatory Visit: Payer: Self-pay | Admitting: Nurse Practitioner

## 2017-12-30 DIAGNOSIS — R921 Mammographic calcification found on diagnostic imaging of breast: Secondary | ICD-10-CM

## 2018-01-04 ENCOUNTER — Telehealth: Payer: Self-pay | Admitting: Nurse Practitioner

## 2018-01-04 NOTE — Telephone Encounter (Signed)
Copied from CRM [id number]. Topic: Quick Communication - Rx Refill/Question >> Jan 04, 2018  2:16 PM Eston Mould B wrote: Medication: metFORMIN (GLUCOPHAGE-XR) 500 MG 24 hr tablet  Has the patient contacted their pharmacy? {no new rx (Agent: If no, request that the patient contact the pharmacy for the refill.) (Agent: If yes, when and what did the pharmacy advise?)  Preferred Pharmacy (with phone number or street name): [facility] - [place], [place] - [address]. [phone] (Phone) [phone] (Fax)      Agent: Please be advised that RX refills may take up to 3 business days. We ask that you follow-up with your pharmacy.

## 2018-01-05 NOTE — Telephone Encounter (Signed)
Call to pharmacy- had to leave message- read Rx as sent to pharmacy so if not received- it could be filled by pharmacy.

## 2018-01-06 ENCOUNTER — Ambulatory Visit
Admission: RE | Admit: 2018-01-06 | Discharge: 2018-01-06 | Disposition: A | Discharge status: Home / Self Care | Payer: 59 | Source: Ambulatory Visit | Attending: Nurse Practitioner | Admitting: Nurse Practitioner

## 2018-01-06 DIAGNOSIS — R921 Mammographic calcification found on diagnostic imaging of breast: Secondary | ICD-10-CM

## 2018-01-06 DIAGNOSIS — R920 Mammographic microcalcification found on diagnostic imaging of breast: Secondary | ICD-10-CM | POA: Diagnosis not present

## 2018-01-26 ENCOUNTER — Encounter: Payer: Self-pay | Admitting: Family Medicine

## 2018-01-26 ENCOUNTER — Ambulatory Visit (INDEPENDENT_AMBULATORY_CARE_PROVIDER_SITE_OTHER): Payer: 59 | Admitting: Family Medicine

## 2018-01-26 VITALS — BP 120/82 | HR 77 | Temp 98.2°F | Ht 63.25 in | Wt 135.4 lb

## 2018-01-26 DIAGNOSIS — E119 Type 2 diabetes mellitus without complications: Secondary | ICD-10-CM

## 2018-01-26 DIAGNOSIS — E0500 Thyrotoxicosis with diffuse goiter without thyrotoxic crisis or storm: Secondary | ICD-10-CM | POA: Diagnosis not present

## 2018-01-26 DIAGNOSIS — Z0000 Encounter for general adult medical examination without abnormal findings: Secondary | ICD-10-CM | POA: Diagnosis not present

## 2018-01-26 DIAGNOSIS — N926 Irregular menstruation, unspecified: Secondary | ICD-10-CM

## 2018-01-26 LAB — COMPREHENSIVE METABOLIC PANEL
ALBUMIN: 4.4 g/dL (ref 3.5–5.2)
ALT: 11 U/L (ref 0–35)
AST: 17 U/L (ref 0–37)
Alkaline Phosphatase: 54 U/L (ref 39–117)
BILIRUBIN TOTAL: 0.5 mg/dL (ref 0.2–1.2)
BUN: 11 mg/dL (ref 6–23)
CALCIUM: 9.1 mg/dL (ref 8.4–10.5)
CO2: 30 meq/L (ref 19–32)
CREATININE: 0.63 mg/dL (ref 0.40–1.20)
Chloride: 101 mEq/L (ref 96–112)
GFR: 107.27 mL/min (ref >60.00)
Glucose, Bld: 97 mg/dL (ref 70–99)
Potassium: 3.6 mEq/L (ref 3.5–5.1)
Sodium: 138 mEq/L (ref 135–145)
TOTAL PROTEIN: 7.4 g/dL (ref 6.0–8.3)

## 2018-01-26 LAB — LIPID PANEL
CHOL/HDL RATIO: 3
CHOLESTEROL: 200 mg/dL (ref 0–200)
HDL: 72 mg/dL (ref >39.00)
LDL Cholesterol: 117 mg/dL — ABNORMAL HIGH (ref 0–99)
NonHDL: 128.44
TRIGLYCERIDES: 55 mg/dL (ref 0.0–149.0)
VLDL: 11 mg/dL (ref 0.0–40.0)

## 2018-01-26 NOTE — Assessment & Plan Note (Signed)
Reviewed preventive care protocols, scheduled due services, and updated immunizations Discussed nutrition, exercise, diet, and healthy lifestyle.  

## 2018-01-26 NOTE — Assessment & Plan Note (Signed)
Well controlled on current dose of metformin. Denies pneumococcal vaccination today. Urine micro ordered. Lipid panel ordered, refer to optho. The patient indicates understanding of these issues and agrees with the plan.

## 2018-01-26 NOTE — Patient Instructions (Signed)
Great to meet you. I will call you with your lab results from today and you can view them online.   We will call you with your referrals.

## 2018-01-26 NOTE — Assessment & Plan Note (Signed)
Normal thyroid function in 11/2017.

## 2018-01-26 NOTE — Progress Notes (Signed)
Subjective:   Patient ID: Kaitlin Bowman, female    DOB: 08/21/1970, 47 y.o.   MRN: [id number]  Kaitlin Bowman is a pleasant 47 y.o. year old female who presents to clinic today with Annual Exam (CPE--fasting--referral to GYN in [place]?)  on 01/26/2018  HPI:  Patient is new to me.  Chart reviewed.  [facility]  She is asking for referral to GYN and optho. Health Maintenance  Topic Date Due  . PNEUMOCOCCAL POLYSACCHARIDE VACCINE (1) 03/22/1972  . FOOT EXAM  03/22/1980  . OPHTHALMOLOGY EXAM  03/22/1980  . URINE MICROALBUMIN  03/22/1980  . PAP SMEAR  03/23/1991  . HIV Screening  01/27/2019 (Originally 03/22/1985)  . INFLUENZA VACCINE  03/18/2018  . HEMOGLOBIN A1C  06/02/2018  . TETANUS/TDAP  03/19/2023   DM- controlled with Metformin. Lab Results  Component Value Date   HGBA1C 6.9 (H) 12/01/2017     Current Outpatient Medications on File Prior to Visit  Medication Sig Dispense Refill  . aspirin-acetaminophen-caffeine (EXCEDRIN MIGRAINE) 250-250-65 MG tablet Take 1 tablet by mouth every 6 (six) hours as needed (for migraines).     . metFORMIN (GLUCOPHAGE-XR) 500 MG 24 hr tablet Take 1 tablet (500 mg total) by mouth 2 (two) times daily with a meal. 180 tablet 1  . meclizine (ANTIVERT) 25 MG tablet Take 1 tablet (25 mg total) by mouth 3 (three) times daily as needed for dizziness. (Patient not taking: Reported on 12/01/2017) 30 tablet 0   No current facility-administered medications on file prior to visit.     Allergies  Allergen Reactions  . Mango Flavor [Flavoring Agent] Swelling and Other (See Comments)    Mouth and airway both swell (no shortness of breath, however) Blisters, also  . Other Anxiety    Med (name not recalled by the patient) that treated nausea/vomiting- NOT Promethazine, Compazine, or Zofran; I asked)    Past Medical History:  Diagnosis Date  . Anemia   . Diabetes mellitus without complication (HCC)   . Menorrhagia   . Thyroid  disease    hyperthyroidism    Past Surgical History:  Procedure Laterality Date  . DILATION AND CURETTAGE OF UTERUS      Family History  Problem Relation Age of Onset  . Diabetes Mother [id number]  . Heart disease Mother   . Cancer Mother        cervial  . Heart disease Father   . Dementia Father   . Diabetes Brother   . Cancer Maternal Aunt        Thyroid  . Heart disease Maternal Grandmother   . Anxiety disorder Neg Hx     Social History   Socioeconomic History  . Marital status: Single    Spouse name: Not on file  . Number of children: Not on file  . Years of education: Not on file  . Highest education level: Not on file  Occupational History  . Not on file  Social Needs  . Financial resource strain: Not on file  . Food insecurity:    Worry: Not on file    Inability: Not on file  . Transportation needs:    Medical: Not on file    Non-medical: Not on file  Tobacco Use  . Smoking status: Never Smoker  . Smokeless tobacco: Never Used  Substance and Sexual Activity  . Alcohol use: No    Frequency: Never  . Drug use: No  . Sexual activity: Not Currently  Lifestyle  .  Physical activity:    Days per week: Not on file    Minutes per session: Not on file  . Stress: Not on file  Relationships  . Social connections:    Talks on phone: Not on file    Gets together: Not on file    Attends religious service: Not on file    Active member of club or organization: Not on file    Attends meetings of clubs or organizations: Not on file    Relationship status: Not on file  . Intimate partner violence:    Fear of current or ex partner: Not on file    Emotionally abused: Not on file    Physically abused: Not on file    Forced sexual activity: Not on file  Other Topics Concern  . Not on file  Social History Narrative  . Not on file   The PMH, PSH, Social History, Family History, Medications, and allergies have been reviewed in [facility], and have been updated if  relevant.   Review of Systems  Constitutional: Negative.   HENT: Negative.   Eyes: Negative.   Respiratory: Negative.   Cardiovascular: Negative.   Gastrointestinal: Negative.   Endocrine: Negative.   Genitourinary: Negative.   Musculoskeletal: Negative.   Skin: Negative.   Allergic/Immunologic: Negative.   Neurological: Negative.   Hematological: Negative.   Psychiatric/Behavioral: Negative.   All other systems reviewed and are negative.      Objective:    BP 120/82   Pulse 77   Temp 98.2 F (36.8 C) (Oral)   Ht 5' 3.25" (1.607 m)   Wt 135 lb 6.4 oz (61.4 kg)   LMP 01/19/2018   SpO2 98%   BMI 23.80 kg/m    Physical Exam   General:  Well-developed,well-nourished,in no acute distress; alert,appropriate and cooperative throughout examination Head:  normocephalic and atraumatic.   Eyes:  vision grossly intact, PERRL Ears:  R ear normal and L ear normal externally, TMs clear bilaterally Nose:  no external deformity.   Mouth:  good dentition.   Neck:  No deformities, masses, or tenderness noted. Breasts:  No mass, nodules, thickening, tenderness, bulging, retraction, inflamation, nipple discharge or skin changes noted.   Lungs:  Normal respiratory effort, chest expands symmetrically. Lungs are clear to auscultation, no crackles or wheezes. Heart:  Normal rate and regular rhythm. S1 and S2 normal without gallop, murmur, click, rub or other extra sounds. Abdomen:  Bowel sounds positive,abdomen soft and non-tender without masses, organomegaly or hernias noted. Msk:  No deformity or scoliosis noted of thoracic or lumbar spine.   Extremities:  No clubbing, cyanosis, edema, or deformity noted with normal full range of motion of all joints.   Neurologic:  alert & oriented X3 and gait normal.   Skin:  Intact without suspicious lesions or rashes Cervical Nodes:  No lymphadenopathy noted Axillary Nodes:  No palpable lymphadenopathy Psych:  Cognition and judgment appear  intact. Alert and cooperative with normal attention span and concentration. No apparent delusions, illusions, hallucinations       Assessment & Plan:   Type 2 diabetes mellitus without complication, without long-term current use of insulin (HCC) - Plan: POCT UA - Microalbumin, Comprehensive metabolic panel, Lipid panel, Ambulatory referral to Ophthalmology  Well woman exam without gynecological exam  Irregular periods - Plan: Ambulatory referral to Gynecology No follow-ups on file.

## 2018-01-27 ENCOUNTER — Encounter: Payer: Self-pay | Admitting: Nurse Practitioner

## 2018-03-30 ENCOUNTER — Ambulatory Visit: Payer: 59 | Admitting: Physician Assistant

## 2018-04-28 ENCOUNTER — Ambulatory Visit (INDEPENDENT_AMBULATORY_CARE_PROVIDER_SITE_OTHER): Payer: 59 | Admitting: Physician Assistant

## 2018-04-28 ENCOUNTER — Encounter: Payer: Self-pay | Admitting: Physician Assistant

## 2018-04-28 VITALS — BP 139/74 | HR 74 | Ht 63.25 in | Wt 136.0 lb

## 2018-04-28 DIAGNOSIS — E119 Type 2 diabetes mellitus without complications: Secondary | ICD-10-CM

## 2018-04-28 DIAGNOSIS — E0500 Thyrotoxicosis with diffuse goiter without thyrotoxic crisis or storm: Secondary | ICD-10-CM

## 2018-04-28 NOTE — Patient Instructions (Signed)
Will get labs at next visit.

## 2018-04-28 NOTE — Progress Notes (Signed)
Subjective:    Patient ID: Kaitlin Bowman, female    DOB: [birth year], 47 y.o.   MRN: [id number]  HPI Pt is a 47 yo female who presents to the clinic to establish care.   .. Active Ambulatory Problems    Diagnosis Date Noted  . Type 2 diabetes mellitus without complication, without long-term current use of insulin (HCC) 12/01/2017  . 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 Anemia, iron deficiency 12/11/2017  . 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 Vitamin D deficiency 12/11/2017  . Graves disease 12/11/2017  . Vertigo 12/11/2017  . Well woman exam without gynecological exam 01/26/2018  . Irregular periods 01/26/2018   Resolved Ambulatory Problems    Diagnosis Date Noted  . No Resolved Ambulatory Problems   Past Medical History:  Diagnosis Date  . Anemia   . Diabetes mellitus without complication (HCC)   . Menorrhagia   . Thyroid disease    .Marland Kitchen Family History  Problem Relation Age of Onset  . Diabetes Mother 42  . Heart disease Mother   . Cancer Mother        cervial  . Heart disease Father   . Dementia Father   . Diabetes Brother   . Cancer Maternal Aunt        Thyroid  . Heart disease Maternal Grandmother   . Anxiety disorder Neg Hx    .Marland Kitchen Social History   Socioeconomic History  . Marital status: Single    Spouse name: Not on file  . Number of children: Not on file  . Years of education: Not on file  . Highest education level: Not on file  Occupational History  . Not on file  Social Needs  . Financial resource strain: Not on file  . Food insecurity:    Worry: Not on file    Inability: Not on file  . Transportation needs:    Medical: Not on file    Non-medical: Not on file  Tobacco Use  . Smoking status: Never Smoker  . Smokeless tobacco: Never Used  Substance and Sexual Activity  . Alcohol use: No    Frequency: Never  . Drug use: No  . Sexual activity: Not Currently  Lifestyle  . Physical activity:    Days per week: Not on file    Minutes per session: Not on  file  . Stress: Not on file  Relationships  . Social connections:    Talks on phone: Not on file    Gets together: Not on file    Attends religious service: Not on file    Active member of club or organization: Not on file    Attends meetings of clubs or organizations: Not on file    Relationship status: Not on file  . Intimate partner violence:    Fear of current or ex partner: Not on file    Emotionally abused: Not on file    Physically abused: Not on file    Forced sexual activity: Not on file  Other Topics Concern  . Not on file  Social History Narrative  . Not on file    She has no complaints or concerns today. She is taking only metformin. She is not checking blood sugars. She denies any open sores or wounds.   Review of Systems  All other systems reviewed and are negative.      Objective:   Physical Exam  Constitutional: She is oriented to person, place, and time. She appears well-developed and well-nourished.  HENT:  Head: Normocephalic and atraumatic.  Eyes: Conjunctivae and EOM are normal.  Neck: Normal range of motion. Neck supple. No thyromegaly present.  Cardiovascular: Normal rate and regular rhythm.  Pulmonary/Chest: Effort normal.  Lymphadenopathy:    She has no cervical adenopathy.  Neurological: She is alert and oriented to person, place, and time.  Psychiatric: She has a normal mood and affect. Her behavior is normal.          Assessment & Plan:  Marland KitchenMarland KitchenDiagnoses and all orders for this visit:  Type 2 diabetes mellitus without complication, without long-term current use of insulin (HCC)  Graves disease  .Marland Kitchen Depression screen [facility] 2/9 04/28/2018 12/01/2017  Decreased Interest 2 0  Down, Depressed, Hopeless 2 0  PHQ - 2 Score 4 0  Altered sleeping 2 -  Tired, decreased energy 3 -  Change in appetite 3 -  Feeling bad or failure about yourself  1 -  Trouble concentrating 0 -  Moving slowly or fidgety/restless 0 -  Suicidal thoughts 0 -  PHQ-9  Score 13 -  Difficult doing work/chores Somewhat difficult -   .. GAD 7 : Generalized Anxiety Score 04/28/2018  Nervous, Anxious, on Edge 1  Control/stop worrying 1  Worry too much - different things 1  Trouble relaxing 1  Restless 0  Easily annoyed or irritable 1  Afraid - awful might happen 1  Total GAD 7 Score 6  Anxiety Difficulty Somewhat difficult     Last labs in April. Pt is scheduled to come back in October and states she will do labs then.   She declines all vaccines today. She is aware of risk.   Pt is concerned about her family member health. She thinks that is why she is anxious and depressed. Work on exercise and self care.

## 2018-04-29 ENCOUNTER — Encounter: Payer: Self-pay | Admitting: Physician Assistant

## 2018-05-28 ENCOUNTER — Encounter: Payer: Self-pay | Admitting: Physician Assistant

## 2018-07-12 ENCOUNTER — Other Ambulatory Visit: Payer: Self-pay | Admitting: Physician Assistant

## 2018-07-12 DIAGNOSIS — E119 Type 2 diabetes mellitus without complications: Secondary | ICD-10-CM

## 2018-07-12 DIAGNOSIS — E0500 Thyrotoxicosis with diffuse goiter without thyrotoxic crisis or storm: Secondary | ICD-10-CM

## 2018-07-12 DIAGNOSIS — Z1329 Encounter for screening for other suspected endocrine disorder: Secondary | ICD-10-CM | POA: Diagnosis not present

## 2018-07-12 MED ORDER — METFORMIN HCL ER 500 MG PO TB24
500.0000 mg | ORAL_TABLET | Freq: Two times a day (BID) | ORAL | 0 refills | Status: DC
Start: 2018-07-12 — End: 2018-10-21

## 2018-07-12 MED ORDER — AMBULATORY NON FORMULARY MEDICATION: 3 refills | Status: DC

## 2018-07-12 NOTE — Progress Notes (Signed)
Pt needs labs.

## 2018-07-13 LAB — HEMOGLOBIN A1C
HEMOGLOBIN A1C: 6.6 %{Hb} — AB (ref <5.7)
Mean Plasma Glucose: 143 (calc)
eAG (mmol/L): 7.9 (calc)

## 2018-07-13 LAB — TSH+FREE T4: TSH W/REFLEX TO FT4: 1.16 mIU/L

## 2018-07-13 NOTE — Progress Notes (Signed)
Call pt: a1c has improved some. Way to go. Stay on same treatment. Thyroid is perfect.

## 2018-07-14 ENCOUNTER — Other Ambulatory Visit: Payer: Self-pay | Admitting: Physician Assistant

## 2018-07-14 MED ORDER — AMBULATORY NON FORMULARY MEDICATION: 3 refills | Status: DC

## 2018-10-20 ENCOUNTER — Other Ambulatory Visit: Payer: Self-pay | Admitting: Physician Assistant

## 2018-10-20 DIAGNOSIS — E119 Type 2 diabetes mellitus without complications: Secondary | ICD-10-CM

## 2018-12-01 ENCOUNTER — Other Ambulatory Visit: Payer: Self-pay | Admitting: Physician Assistant

## 2018-12-01 DIAGNOSIS — E119 Type 2 diabetes mellitus without complications: Secondary | ICD-10-CM

## 2018-12-01 MED ORDER — METFORMIN HCL ER 500 MG PO TB24: 500.0000 mg | ORAL_TABLET | Freq: Two times a day (BID) | ORAL | 0 refills | Status: DC

## 2018-12-08 ENCOUNTER — Other Ambulatory Visit: Payer: Self-pay | Admitting: Physician Assistant

## 2018-12-08 DIAGNOSIS — Z0000 Encounter for general adult medical examination without abnormal findings: Secondary | ICD-10-CM

## 2018-12-08 DIAGNOSIS — E0500 Thyrotoxicosis with diffuse goiter without thyrotoxic crisis or storm: Secondary | ICD-10-CM

## 2018-12-08 DIAGNOSIS — E119 Type 2 diabetes mellitus without complications: Secondary | ICD-10-CM

## 2018-12-08 DIAGNOSIS — E049 Nontoxic goiter, unspecified: Secondary | ICD-10-CM

## 2018-12-08 MED ORDER — METFORMIN HCL ER 500 MG PO TB24: 500.0000 mg | ORAL_TABLET | Freq: Two times a day (BID) | ORAL | 0 refills | Status: DC

## 2018-12-08 NOTE — Telephone Encounter (Signed)
Patient made aware lab order sent to [facility] for her to complete.

## 2018-12-08 NOTE — Telephone Encounter (Signed)
Medication and lab orders entered.   Jade - please advise if any other labs to add to annual bloodwork.

## 2018-12-08 NOTE — Telephone Encounter (Signed)
PT requested for annual bloodwork to be sent down stairs to [facility].  Also requested 30 day supply for metformin to hold until physical on 5/29.  Please advise.

## 2018-12-23 ENCOUNTER — Other Ambulatory Visit: Payer: Self-pay | Admitting: Physician Assistant

## 2018-12-23 DIAGNOSIS — E119 Type 2 diabetes mellitus without complications: Secondary | ICD-10-CM

## 2018-12-24 ENCOUNTER — Other Ambulatory Visit: Payer: Self-pay | Admitting: Physician Assistant

## 2018-12-24 DIAGNOSIS — R921 Mammographic calcification found on diagnostic imaging of breast: Secondary | ICD-10-CM

## 2019-01-07 ENCOUNTER — Other Ambulatory Visit: Payer: Self-pay

## 2019-01-07 DIAGNOSIS — E119 Type 2 diabetes mellitus without complications: Secondary | ICD-10-CM

## 2019-01-07 DIAGNOSIS — Z0000 Encounter for general adult medical examination without abnormal findings: Secondary | ICD-10-CM

## 2019-01-08 LAB — CBC WITH DIFFERENTIAL/PLATELET
Absolute Monocytes: 273 cells/uL (ref 200–950)
Basophils Absolute: 122 cells/uL (ref 0–200)
Basophils Relative: 2.9 %
Eosinophils Absolute: 50 cells/uL (ref 15–500)
Eosinophils Relative: 1.2 %
HCT: 30 % — ABNORMAL LOW (ref 35.0–45.0)
Hemoglobin: 8.8 g/dL — ABNORMAL LOW (ref 11.7–15.5)
Lymphs Abs: 1823 cells/uL (ref 850–3900)
MCH: 20.8 pg — ABNORMAL LOW (ref 27.0–33.0)
MCHC: 29.3 g/dL — ABNORMAL LOW (ref 32.0–36.0)
MCV: 70.8 fL — ABNORMAL LOW (ref 80.0–100.0)
MPV: 9.2 fL (ref 7.5–12.5)
Monocytes Relative: 6.5 %
Neutro Abs: 1932 cells/uL (ref 1500–7800)
Neutrophils Relative %: 46 %
Platelets: 520 10*3/uL — ABNORMAL HIGH (ref 140–400)
RBC: 4.24 10*6/uL (ref 3.80–5.10)
RDW: 17.1 % — ABNORMAL HIGH (ref 11.0–15.0)
Total Lymphocyte: 43.4 %
WBC: 4.2 10*3/uL (ref 3.8–10.8)

## 2019-01-08 LAB — LIPID PANEL
Cholesterol: 174 mg/dL
HDL: 69 mg/dL
LDL Cholesterol (Calc): 90 mg/dL
Non-HDL Cholesterol (Calc): 105 mg/dL
Total CHOL/HDL Ratio: 2.5 (calc)
Triglycerides: 63 mg/dL

## 2019-01-08 LAB — HEMOGLOBIN A1C
Hgb A1c MFr Bld: 6.5 % of total Hgb — ABNORMAL HIGH (ref <5.7)
Mean Plasma Glucose: 140 (calc)
eAG (mmol/L): 7.7 (calc)

## 2019-01-08 LAB — COMPLETE METABOLIC PANEL WITHOUT GFR
AG Ratio: 1.5 (calc) (ref 1.0–2.5)
ALT: 11 U/L (ref 6–29)
AST: 17 U/L (ref 10–35)
Albumin: 4.3 g/dL (ref 3.6–5.1)
Alkaline phosphatase (APISO): 53 U/L (ref 31–125)
BUN: 10 mg/dL (ref 7–25)
CO2: 28 mmol/L (ref 20–32)
Calcium: 9.2 mg/dL (ref 8.6–10.2)
Chloride: 104 mmol/L (ref 98–110)
Creat: 0.6 mg/dL (ref 0.50–1.10)
GFR, Est African American: 125 mL/min/1.73m2
GFR, Est Non African American: 108 mL/min/1.73m2
Globulin: 2.9 g/dL (ref 1.9–3.7)
Glucose, Bld: 111 mg/dL — ABNORMAL HIGH (ref 65–99)
Potassium: 4.1 mmol/L (ref 3.5–5.3)
Sodium: 138 mmol/L (ref 135–146)
Total Bilirubin: 0.5 mg/dL (ref 0.2–1.2)
Total Protein: 7.2 g/dL (ref 6.1–8.1)

## 2019-01-08 LAB — TSH+FREE T4: TSH W/REFLEX TO FT4: 1.84 m[IU]/L

## 2019-01-11 ENCOUNTER — Encounter: Payer: Self-pay | Admitting: Physician Assistant

## 2019-01-11 ENCOUNTER — Other Ambulatory Visit: Payer: Self-pay

## 2019-01-11 ENCOUNTER — Ambulatory Visit
Admission: RE | Admit: 2019-01-11 | Discharge: 2019-01-11 | Disposition: A | Discharge status: Home / Self Care | Payer: 59 | Source: Ambulatory Visit | Attending: Physician Assistant | Admitting: Physician Assistant

## 2019-01-11 DIAGNOSIS — R921 Mammographic calcification found on diagnostic imaging of breast: Secondary | ICD-10-CM

## 2019-01-11 DIAGNOSIS — D649 Anemia, unspecified: Secondary | ICD-10-CM | POA: Insufficient documentation

## 2019-01-11 NOTE — Progress Notes (Signed)
Call pt: a1c is stable at 6.5. cholesterol looks good but LDL is not to goal of under 70. I would suggest a low dose statin to ge you to goal and lower CV risk.  Kidney and liver look great.   YOU are very anemic. Are you bleeding heavy? Are you having stomach pain? Did you stop any iron supplements? Has diet changed? Have you noticed any blood in stool?

## 2019-01-14 ENCOUNTER — Telehealth: Payer: Self-pay | Admitting: Physician Assistant

## 2019-01-14 ENCOUNTER — Ambulatory Visit (INDEPENDENT_AMBULATORY_CARE_PROVIDER_SITE_OTHER): Payer: 59 | Admitting: Physician Assistant

## 2019-01-14 ENCOUNTER — Encounter: Payer: Self-pay | Admitting: Physician Assistant

## 2019-01-14 VITALS — BP 146/84 | HR 90 | Ht 63.0 in | Wt 137.0 lb

## 2019-01-14 DIAGNOSIS — E785 Hyperlipidemia, unspecified: Secondary | ICD-10-CM | POA: Diagnosis not present

## 2019-01-14 DIAGNOSIS — E119 Type 2 diabetes mellitus without complications: Secondary | ICD-10-CM | POA: Diagnosis not present

## 2019-01-14 DIAGNOSIS — Z0000 Encounter for general adult medical examination without abnormal findings: Secondary | ICD-10-CM

## 2019-01-14 DIAGNOSIS — D508 Other iron deficiency anemias: Secondary | ICD-10-CM | POA: Diagnosis not present

## 2019-01-14 DIAGNOSIS — I10 Essential (primary) hypertension: Secondary | ICD-10-CM | POA: Insufficient documentation

## 2019-01-14 DIAGNOSIS — R809 Proteinuria, unspecified: Secondary | ICD-10-CM

## 2019-01-14 LAB — POCT UA - MICROALBUMIN
Creatinine, POC: 50 mg/dL
Microalbumin Ur, POC: 10 mg/L

## 2019-01-14 MED ORDER — METFORMIN HCL ER 500 MG PO TB24: 500.0000 mg | ORAL_TABLET | Freq: Two times a day (BID) | ORAL | 1 refills | Status: DC

## 2019-01-14 MED ORDER — FERROUS SULFATE 325 (65 FE) MG PO TBEC: 325.0000 mg | DELAYED_RELEASE_TABLET | Freq: Two times a day (BID) | ORAL | 3 refills | Status: DC

## 2019-01-14 NOTE — Patient Instructions (Addendum)

## 2019-01-14 NOTE — Progress Notes (Signed)
You are spilling some protein in urine. This can come from diabetes. When we see this we like to add and ace inhibitor to help protect the kidney. Are you ok with starting lisinopril?

## 2019-01-14 NOTE — Telephone Encounter (Signed)
Please abstract mammogram into EMR.

## 2019-01-14 NOTE — Progress Notes (Signed)
Subjective:     Kaitlin Bowman is a 48 y.o. female and is here for a comprehensive physical exam. The patient reports problems - she had one episode of palpitation and then her vision went dark for 5 seconds and then everything went back to normal. .  Call pt: a1c is stable at 6.5. cholesterol looks good but LDL is not to goal of under 70. I would suggest a low dose statin to ge you to goal and lower CV risk.  Kidney and liver look great.   YOU are very anemic. Are you bleeding heavy? Are you having stomach pain? Did you stop any iron supplements? Has diet changed? Have you noticed any blood in stool?  Pap smear- last year no sexual activty. 2015 normal.   Mammogram just done.   PAC weird vision went dark. Less than 5sec.   Social History   Socioeconomic History  . Marital status: Single    Spouse name: Not on file  . Number of children: Not on file  . Years of education: Not on file  . Highest education level: Not on file  Occupational History  . Not on file  Social Needs  . Financial resource strain: Not on file  . Food insecurity:    Worry: Not on file    Inability: Not on file  . Transportation needs:    Medical: Not on file    Non-medical: Not on file  Tobacco Use  . Smoking status: Never Smoker  . Smokeless tobacco: Never Used  Substance and Sexual Activity  . Alcohol use: No    Frequency: Never  . Drug use: No  . Sexual activity: Not Currently  Lifestyle  . Physical activity:    Days per week: Not on file    Minutes per session: Not on file  . Stress: Not on file  Relationships  . Social connections:    Talks on phone: Not on file    Gets together: Not on file    Attends religious service: Not on file    Active member of club or organization: Not on file    Attends meetings of clubs or organizations: Not on file    Relationship status: Not on file  . Intimate partner violence:    Fear of current or ex partner: Not on file    Emotionally abused: Not on file     Physically abused: Not on file    Forced sexual activity: Not on file  Other Topics Concern  . Not on file  Social History Narrative  . Not on file   Health Maintenance  Topic Date Due  . FOOT EXAM  03/22/1980  . URINE MICROALBUMIN  03/22/1980  . OPHTHALMOLOGY EXAM  03/18/2018  . MAMMOGRAM  01/07/2019  . PAP SMEAR-Modifier  01/14/2019 (Originally 03/23/1991)  . HIV Screening  01/27/2019 (Originally 03/22/1985)  . PNEUMOCOCCAL POLYSACCHARIDE VACCINE AGE 77-64 HIGH RISK  04/29/2019 (Originally 03/22/1972)  . INFLUENZA VACCINE  03/19/2019  . HEMOGLOBIN A1C  07/10/2019  . TETANUS/TDAP  03/30/2023    The following portions of the patient's history were reviewed and updated as appropriate: allergies, current medications, past family history, past medical history, past social history, past surgical history and problem list.  Review of Systems Pertinent items noted in HPI and remainder of comprehensive ROS otherwise negative.   Objective:    BP (!) 146/84   Pulse 90   Ht 5\' 3"  (1.6 m)   Wt 137 lb (62.1 kg)   SpO2  100%   BMI 24.27 kg/m  General appearance: alert, cooperative and appears stated age Head: Normocephalic, without obvious abnormality, atraumatic Eyes: conjunctivae/corneas clear. PERRL, EOM's intact. Fundi benign. Ears: normal TM's and external ear canals both ears Nose: Nares normal. Septum midline. Mucosa normal. No drainage or sinus tenderness. Throat: lips, mucosa, and tongue normal; teeth and gums normal Neck: no adenopathy, no carotid bruit, no JVD, supple, symmetrical, trachea midline and thyroid not enlarged, symmetric, no tenderness/mass/nodules Back: symmetric, no curvature. ROM normal. No CVA tenderness. Lungs: clear to auscultation bilaterally Heart: regular rate and rhythm, S1, S2 normal, no murmur, click, rub or gallop Abdomen: soft, non-tender; bowel sounds normal; no masses,  no organomegaly Extremities: extremities normal, atraumatic, no cyanosis or  edema Pulses: 2+ and symmetric Skin: Skin color, texture, turgor normal. No rashes or lesions Lymph nodes: Cervical, supraclavicular, and axillary nodes normal. Neurologic: Alert and oriented X 3, normal strength and tone. Normal symmetric reflexes. Normal coordination and gait    Assessment:    Healthy female exam.      Plan:    Marland Kitchen.Marland Kitchen.[name] was seen today for annual exam.  Diagnoses and all orders for this visit:  Routine physical examination  Type 2 diabetes mellitus without complication, without long-term current use of insulin (HCC) -     metFORMIN (GLUCOPHAGE-XR) 500 MG 24 hr tablet; Take 1 tablet (500 mg total) by mouth 2 (two) times daily. -     POCT UA - Microalbumin  Other iron deficiency anemia -     ferrous sulfate 325 (65 FE) MG EC tablet; Take 1 tablet (325 mg total) by mouth 2 (two) times daily with a meal.  Microalbuminuria  Hyperlipidemia LDL goal <70  .Marland Kitchen. Depression screen [facility] 2/9 01/14/2019 04/28/2018 12/01/2017  Decreased Interest 1 2 0  Down, Depressed, Hopeless 1 2 0  PHQ - 2 Score 2 4 0  Altered sleeping 1 2 -  Tired, decreased energy 1 3 -  Change in appetite 0 3 -  Feeling bad or failure about yourself  1 1 -  Trouble concentrating 0 0 -  Moving slowly or fidgety/restless 0 0 -  Suicidal thoughts 0 0 -  PHQ-9 Score 5 13 -  Difficult doing work/chores Not difficult at all Somewhat difficult -   .Marland Kitchen. Discussed 150 minutes of exercise a week.  Encouraged vitamin D 1000 units and Calcium 1300mg  or 4 servings of dairy a day.  Fasting labs done.  Mammogram up to date. Needs to be abstracted. Needs pap. Pt is aware.   Hemoglobin was 8.8. start ferritin. Per pt has been worked up for other causes of anemia. She has not been on her supplement. Restart and check in 2 months.   A!C controlled.  Continue metformin.  Needs eye exam.  Protein found in urine. Sent pt a msg to consider ACE.  Discussed need for statin. Pt declined today. LDL was 90 with goal of  70.  .. Diabetic Foot Exam - Simple   Simple Foot Form Diabetic Foot exam was performed with the following findings:  Yes 01/14/2019 11:00 AM  Visual Inspection No deformities, no ulcerations, no other skin breakdown bilaterally:  Yes Sensation Testing Intact to touch and monofilament testing bilaterally:  Yes Pulse Check Posterior Tibialis and Dorsalis pulse intact bilaterally:  Yes Comments    Needs eye exam.   BP not controlled. Pt resistant to medication. Keep log for the next 2weeks. Discussed goal of 130/90.   See After Visit Summary for Counseling  Recommendations

## 2019-01-18 MED ORDER — LISINOPRIL 5 MG PO TABS: 5.0000 mg | ORAL_TABLET | Freq: Every day | ORAL | 0 refills | Status: DC

## 2019-01-18 NOTE — Progress Notes (Signed)
Normal mammogram. Follow up in 1 year.

## 2019-01-18 NOTE — Addendum Note (Signed)
Addended by: Jomarie Longs on: 01/18/2019 11:42 AM   Modules accepted: Orders

## 2019-01-18 NOTE — Progress Notes (Signed)
Recheck protein and kidney function in the next 3 months. Common side effect is a dry cough. If develop let me know so I can change up medication.

## 2019-02-24 ENCOUNTER — Other Ambulatory Visit: Payer: Self-pay | Admitting: Oncology

## 2019-03-01 DIAGNOSIS — Z01419 Encounter for gynecological examination (general) (routine) without abnormal findings: Secondary | ICD-10-CM | POA: Diagnosis not present

## 2019-03-01 DIAGNOSIS — Z6824 Body mass index (BMI) 24.0-24.9, adult: Secondary | ICD-10-CM | POA: Diagnosis not present

## 2019-03-01 DIAGNOSIS — Z1389 Encounter for screening for other disorder: Secondary | ICD-10-CM | POA: Diagnosis not present

## 2019-03-01 DIAGNOSIS — Z130 Encounter for screening for diseases of the blood and blood-forming organs and certain disorders involving the immune mechanism: Secondary | ICD-10-CM | POA: Diagnosis not present

## 2019-03-01 DIAGNOSIS — Z124 Encounter for screening for malignant neoplasm of cervix: Secondary | ICD-10-CM | POA: Diagnosis not present

## 2019-03-11 DIAGNOSIS — H5213 Myopia, bilateral: Secondary | ICD-10-CM | POA: Diagnosis not present

## 2019-03-11 DIAGNOSIS — Z7984 Long term (current) use of oral hypoglycemic drugs: Secondary | ICD-10-CM | POA: Diagnosis not present

## 2019-03-11 DIAGNOSIS — H52222 Regular astigmatism, left eye: Secondary | ICD-10-CM | POA: Diagnosis not present

## 2019-03-11 DIAGNOSIS — E119 Type 2 diabetes mellitus without complications: Secondary | ICD-10-CM | POA: Diagnosis not present

## 2019-07-19 ENCOUNTER — Telehealth: Payer: Self-pay | Admitting: Neurology

## 2019-07-19 DIAGNOSIS — D508 Other iron deficiency anemias: Secondary | ICD-10-CM

## 2019-07-19 DIAGNOSIS — E119 Type 2 diabetes mellitus without complications: Secondary | ICD-10-CM

## 2019-07-19 NOTE — Telephone Encounter (Signed)
Patient called back and wants to get labs prior to next appt. Looks like she had labs in May, please advise what is needed.

## 2019-07-19 NOTE — Telephone Encounter (Signed)
Patient left vm talking about needing labs before her visit, but also having some at her GYN office. It was unclear exactly what was needed. Left message on machine for patient to call back to clarify.

## 2019-07-20 NOTE — Telephone Encounter (Signed)
Needs a1c, cbc, ferritin, iron stores

## 2019-07-20 NOTE — Telephone Encounter (Signed)
Labs ordered. Left message on machine for patient making her aware can go to lab.

## 2019-07-26 ENCOUNTER — Telehealth: Payer: Self-pay | Admitting: Neurology

## 2019-07-26 NOTE — Telephone Encounter (Signed)
Patient asked for lab slip to be mailed. Mailed slip to patient.

## 2019-08-03 ENCOUNTER — Other Ambulatory Visit (HOSPITAL_COMMUNITY)
Admission: RE | Admit: 2019-08-03 | Discharge: 2019-08-03 | Disposition: A | Discharge status: Home / Self Care | Payer: 59 | Source: Other Acute Inpatient Hospital | Attending: Physician Assistant | Admitting: Physician Assistant

## 2019-08-03 DIAGNOSIS — E119 Type 2 diabetes mellitus without complications: Secondary | ICD-10-CM | POA: Insufficient documentation

## 2019-08-03 DIAGNOSIS — D508 Other iron deficiency anemias: Secondary | ICD-10-CM | POA: Insufficient documentation

## 2019-08-03 LAB — IRON AND TIBC
Iron: 159 ug/dL (ref 28–170)
Saturation Ratios: 38 % — ABNORMAL HIGH (ref 10.4–31.8)
TIBC: 413 ug/dL (ref 250–450)
UIBC: 254 ug/dL

## 2019-08-03 LAB — CBC WITH DIFFERENTIAL/PLATELET
Abs Immature Granulocytes: 0.02 10*3/uL (ref 0.00–0.07)
Basophils Absolute: 0.1 10*3/uL (ref 0.0–0.1)
Basophils Relative: 2 %
Eosinophils Absolute: 0 10*3/uL (ref 0.0–0.5)
Eosinophils Relative: 1 %
HCT: 39.4 % (ref 36.0–46.0)
Hemoglobin: 13 g/dL (ref 12.0–15.0)
Immature Granulocytes: 0 %
Lymphocytes Relative: 31 %
Lymphs Abs: 1.9 10*3/uL (ref 0.7–4.0)
MCH: 29.6 pg (ref 26.0–34.0)
MCHC: 33 g/dL (ref 30.0–36.0)
MCV: 89.7 fL (ref 80.0–100.0)
Monocytes Absolute: 0.4 10*3/uL (ref 0.1–1.0)
Monocytes Relative: 7 %
Neutro Abs: 3.6 10*3/uL (ref 1.7–7.7)
Neutrophils Relative %: 59 %
Platelets: 403 10*3/uL — ABNORMAL HIGH (ref 150–400)
RBC: 4.39 MIL/uL (ref 3.87–5.11)
RDW: 14.2 % (ref 11.5–15.5)
WBC: 6.1 10*3/uL (ref 4.0–10.5)
nRBC: 0 % (ref 0.0–0.2)

## 2019-08-03 LAB — HEMOGLOBIN A1C
Hgb A1c MFr Bld: 6.4 % — ABNORMAL HIGH (ref 4.8–5.6)
Mean Plasma Glucose: 136.98 mg/dL

## 2019-08-03 LAB — FERRITIN: Ferritin: 9 ng/mL — ABNORMAL LOW (ref 11–307)

## 2019-08-05 ENCOUNTER — Ambulatory Visit (INDEPENDENT_AMBULATORY_CARE_PROVIDER_SITE_OTHER): Payer: 59 | Admitting: Physician Assistant

## 2019-08-05 ENCOUNTER — Encounter: Payer: Self-pay | Admitting: Physician Assistant

## 2019-08-05 ENCOUNTER — Ambulatory Visit: Payer: 59 | Admitting: Physician Assistant

## 2019-08-05 ENCOUNTER — Other Ambulatory Visit: Payer: Self-pay

## 2019-08-05 VITALS — BP 112/78 | HR 72 | Temp 98.4°F | Resp 15 | Ht 63.0 in | Wt 137.0 lb

## 2019-08-05 DIAGNOSIS — E119 Type 2 diabetes mellitus without complications: Secondary | ICD-10-CM | POA: Diagnosis not present

## 2019-08-05 DIAGNOSIS — R809 Proteinuria, unspecified: Secondary | ICD-10-CM | POA: Diagnosis not present

## 2019-08-05 MED ORDER — METFORMIN HCL ER 500 MG PO TB24: 500.0000 mg | ORAL_TABLET | Freq: Two times a day (BID) | ORAL | 3 refills | Status: DC

## 2019-08-05 NOTE — Progress Notes (Signed)
Patient ID: Kaitlin Bowman, female   DOB: 1970/11/29, 48 y.o.   MRN: [id number] .Marland KitchenVirtual Visit via Video Note  I connected with Kaitlin Bowman on 08/05/19 at  9:50 AM EST by a video enabled telemedicine application and verified that I am speaking with the correct person using two identifiers.  Location: Patient: home Provider: home   I discussed the limitations of evaluation and management by telemedicine and the availability of in person appointments. The patient expressed understanding and agreed to proceed.  History of Present Illness: Patient is a 48 year old female with type 2 diabetes, microalbuminuria, HLD who calls into the clinic for medication refills and follow up.   Patient is doing well on Metformin only and reports being compliant with medication..  She denies any open sores or wounds.  She denies any hypoglycemic events.  She reports she has been to the eye doctor recently underwent eye care in [place].  She did start the lisinopril but developed an ACE cough.  She stopped the lisinopril.  Her microalbumin per patient was checked at gynecologist office and negative.  She feels like she does not need to be on anything.  She feels good. No problems or concerns.    .. Active Ambulatory Problems    Diagnosis Date Noted  . Type 2 diabetes mellitus without complication, without long-term current use of insulin ([place]) 12/01/2017  . 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 Anemia, iron deficiency 12/11/2017  . 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 Vitamin D deficiency 12/11/2017  . Graves disease 12/11/2017  . Vertigo 12/11/2017  . Well woman exam without gynecological exam 01/26/2018  . Irregular periods 01/26/2018  . Anemia 01/11/2019  . Microalbuminuria 01/14/2019  . Hyperlipidemia LDL goal <70 01/14/2019  . Essential hypertension 01/14/2019   Resolved Ambulatory Problems    Diagnosis Date Noted  . No Resolved Ambulatory Problems   Past Medical History:  Diagnosis Date  . Diabetes  mellitus without complication ([place])   . Menorrhagia   . Thyroid disease    Reviewed med, allergy, problem list.    Observations/Objective: No acute distress.  Normal appearance and mood.  No labored breathing.   .. Today's Vitals   08/05/19 0930  BP: 112/78  Pulse: 72  Resp: 15  Temp: 98.4 F (36.9 C)  TempSrc: Oral  Weight: 137 lb (62.1 kg)  Height: 5\' 3"  (1.6 m)   Body mass index is 24.27 kg/m.   Assessment and Plan: Marland KitchenMarland KitchenDevona Bowman was seen today for diabetes.  Diagnoses and all orders for this visit:  Type 2 diabetes mellitus without complication, without long-term current use of insulin (HCC) -     metFORMIN (GLUCOPHAGE-XR) 500 MG 24 hr tablet; Take 1 tablet (500 mg total) by mouth 2 (two) times daily.  Microalbuminuria   .Marland Kitchen Lab Results  Component Value Date   HGBA1C 6.4 (H) 08/03/2019   A1C controlled.  Keep medications the same. Stay on metformin. BP to goal.  Not on statin. Discussed risk and recommendation. Pt will keep thinking.  Hx positive for microalbuminuria. Did not tolerate ACE. Will recheck next time in office cannot see labs from GYN.  Will submit to get copy of eye exam.  Flu UTD.  Needs pneumonia shot. Ok to get at pharmacy.   Follow up in 3 months.       Follow Up Instructions:    I discussed the assessment and treatment plan with the patient. The patient was provided an opportunity to ask questions and all were answered.  The patient agreed with the plan and demonstrated an understanding of the instructions.   The patient was advised to call back or seek an in-person evaluation if the symptoms worsen or if the condition fails to improve as anticipated.   Iran Planas, PA-C

## 2019-08-05 NOTE — Progress Notes (Signed)
Patient states had her urine retested at Gynecologist and didn't have protein in urine. She is not taking lisinopril.  Last a1c 08/03/2019: 6.4 No other issues.

## 2019-08-08 ENCOUNTER — Encounter: Payer: Self-pay | Admitting: Physician Assistant

## 2019-09-29 ENCOUNTER — Telehealth (INDEPENDENT_AMBULATORY_CARE_PROVIDER_SITE_OTHER): Payer: 59 | Admitting: Medical-Surgical

## 2019-09-29 ENCOUNTER — Encounter: Payer: Self-pay | Admitting: Medical-Surgical

## 2019-09-29 ENCOUNTER — Other Ambulatory Visit: Payer: Self-pay | Admitting: Medical-Surgical

## 2019-09-29 DIAGNOSIS — H8110 Benign paroxysmal vertigo, unspecified ear: Secondary | ICD-10-CM | POA: Diagnosis not present

## 2019-09-29 MED ORDER — MECLIZINE HCL 25 MG PO TABS: 25.0000 mg | ORAL_TABLET | Freq: Three times a day (TID) | ORAL | 1 refills | Status: DC | PRN

## 2019-09-29 NOTE — Progress Notes (Signed)
Virtual Visit via Video Note  I connected with Kaitlin Bowman on 09/29/19 at 11:10 AM EST by a video enabled telemedicine application and verified that I am speaking with the correct person using two identifiers.   I discussed the limitations of evaluation and management by telemedicine and the availability of in person appointments. The patient expressed understanding and agreed to proceed.  Subjective:    CC: dizziness, nausea  HPI: Pleasant 49 year old female presenting via [facility] to discuss dizziness, nausea, and vomiting x 24 hours. Feels as if room spinning, worse with position changes and head movement. Nausea with 4-5 episodes of emesis yesterday evening. Feels as if head is unstable and it is very difficult to move around. Poor appetite and unable to hold down solid foods. Feels flushed with some hot/cold flashes. Mild frontal HA. No vision changes, fever, hearing changes, ringing in the ears, recent illness, photophobia, head trauma, or ear surgeries. No new medications. Denies CP, SOB, palpitations, weakness, numbness. Drinking tea to stay hydrated. Was seen in 2018 in the ED for vertigo and prescribed meclizine that was never picked up at the pharmacy.  Reports she is very prone to motion sickness, especially with car rides.  Had 1st dose Phizer COVID vaccine 3 weeks ago.    Past medical history, Surgical history, Family history not pertinant except as noted below, Social history, Allergies, and medications have been entered into the medical record, reviewed, and corrections made.   Review of Systems: No fevers, chills, night sweats, weight loss, chest pain, or shortness of breath.   Objective:    General: Speaking clearly in complete sentences without any shortness of breath.  Alert and oriented x3.  Normal judgment. No apparent acute distress.    Impression and Recommendations:    BPPV Meclizine 25mg  TID prn. Referral to PT for vestibular rehab. Continue hydrating with  water/tea. Increase PO intake as tolerated. Reviewed Epley maneuver and fall precautions.   Return if symptoms worsen or fail to improve.   I discussed the assessment and treatment plan with the patient. The patient was provided an opportunity to ask questions and all were answered. The patient agreed with the plan and demonstrated an understanding of the instructions.   The patient was advised to call back or seek an in-person evaluation if the symptoms worsen or if the condition fails to improve as anticipated.  25 minutes of non-face-to-face time was provided during this encounter.  , DNP, APRN, FNP-BC [facility]

## 2019-11-23 ENCOUNTER — Ambulatory Visit: Payer: 59 | Attending: Medical-Surgical | Admitting: Physical Therapy

## 2019-11-23 ENCOUNTER — Other Ambulatory Visit: Payer: Self-pay

## 2019-11-23 DIAGNOSIS — R2681 Unsteadiness on feet: Secondary | ICD-10-CM | POA: Diagnosis not present

## 2019-11-23 DIAGNOSIS — R42 Dizziness and giddiness: Secondary | ICD-10-CM | POA: Insufficient documentation

## 2019-11-23 NOTE — Therapy (Signed)
[facility] [address] [place], [place], [address] Phone: [phone]   Fax:  [phone]  Physical Therapy Evaluation  Patient Details  Name: Kaitlin Bowman MRN: [id number] Date of Birth: 05/09/1971 Referring Provider (PT): Christen Butter, NP   Encounter Date: 11/23/2019  PT End of Session - 11/23/19 0919    Visit Number  1    Number of Visits  5    Date for PT Re-Evaluation  01/22/20    Authorization Type  [facility]    PT Start Time  0715    PT Stop Time  0800    PT Time Calculation (min)  45 min    Activity Tolerance  Patient tolerated treatment well    Behavior During Therapy  [facility] for tasks assessed/performed       Past Medical History:  Diagnosis Date  . Anemia   . Diabetes mellitus without complication (HCC)   . Menorrhagia   . Thyroid disease    hyperthyroidism    Past Surgical History:  Procedure Laterality Date  . DILATION AND CURETTAGE OF UTERUS      There were no vitals filed for this visit.   Subjective Assessment - 11/23/19 0720    Subjective  Pt reports in February she was sitting at the lunch table and began to feel unstable and then a mild dizziness started - leaning over made it worse.  Went to lie down and when she woke up and tried to get up the dizziness became very severe and experienced vomiting.  Pt had first episode of dizziness 6-7 years ago but it was not as severe - went to ER and PT but they were not confident it was BPPV.  This episode was more severe - not sure it is BPPV.  When she went to the ED they mentioned fluid in the inner ear.  Friend tried maneuver for BPPV but it made it worse.  Pt has been taking Meclizine now any time she begins to feel dizzy.  When she walks she holds her head and moves slowly.    Pertinent History  anemia, DM, hyperthyroidism    Patient Stated Goals  Wants to find out what the cause of the dizziness is    Currently in Pain?  No/denies         [facility] PT  Assessment - 11/23/19 0730      Assessment   Medical Diagnosis  Vertigo    Referring Provider (PT)  Christen Butter, NP    Onset Date/Surgical Date  09/29/19    Prior Therapy  yes for vertigo      Precautions   Precautions  Other (comment)    Precaution Comments  anemia, DM, hyperthyroidism      Balance Screen   Has the patient fallen in the past 6 months  No      Prior Function   Level of Independence  Independent    Vocation  Full time employment    Leisure  walking      Observation/Other Assessments   Focus on Therapeutic Outcomes (FOTO)   93% function    Other Surveys   Dizziness Handicap Inventory (DHI)    Dizziness Handicap Inventory (DHI)   4      Sensation   Light Touch  Appears Intact      ROM / Strength   AROM / PROM / Strength  Strength      Strength   Overall Strength  Within functional limits for tasks  performed           Vestibular Assessment - 11/23/19 0732      Vestibular Assessment   General Observation  Pt had vaccine for COVID before severe episode of vertigo; had second vaccine and vertigo returned      Symptom Behavior   Subjective history of current problem  denies changes in hearing and denies tinnitus, denies changes in vision.  Reports a mild headache.  No neck issues.     Type of Dizziness   Vertigo    Frequency of Dizziness  intermittent    Duration of Dizziness  pt reports spinning her head is constant; but no spinning currently    Symptom Nature  Positional    Aggravating Factors  Lying supine;Turning body quickly;Turning head quickly;Supine to sit    Relieving Factors  Closing eyes;Head stationary    Progression of Symptoms  No change since onset      Oculomotor Exam   Oculomotor Alignment  Normal    Ocular ROM  WFL    Spontaneous  Absent    Gaze-induced   Absent    Smooth Pursuits  Intact    Saccades  Intact    Comment  convergence impaired but may be related to age      Oculomotor Exam-Fixation Suppressed    Left Head  Impulse  positive    Right Head Impulse  negative      Vestibulo-Ocular Reflex   VOR to Slow Head Movement  Normal    VOR Cancellation  Normal      Visual Acuity   Static  9    Dynamic  4   5 line difference     Other Tests   Comments  MCTSIB: 30 seconds all 4 conditions on first attempt      Positional Testing   Dix-Hallpike  Dix-Hallpike Right;Dix-Hallpike Left    Horizontal Canal Testing  Horizontal Canal Right;Horizontal Canal Left      Dix-Hallpike Right   Dix-Hallpike Right Duration  0    Dix-Hallpike Right Symptoms  No nystagmus      Dix-Hallpike Left   Dix-Hallpike Left Duration  0    Dix-Hallpike Left Symptoms  No nystagmus      Horizontal Canal Right   Horizontal Canal Right Duration  0    Horizontal Canal Right Symptoms  Normal      Horizontal Canal Left   Horizontal Canal Left Duration  0    Horizontal Canal Left Symptoms  Normal      Positional Sensitivities   Sit to Supine  No dizziness    Supine to Left Side  No dizziness    Supine to Right Side  No dizziness    Supine to Sitting  Lightheadedness    Right Hallpike  No dizziness    Up from Right Hallpike  No dizziness    Up from Left Hallpike  No dizziness    Nose to Right Knee  No dizziness    Right Knee to Sitting  No dizziness    Nose to Left Knee  No dizziness    Left Knee to Sitting  No dizziness    Head Turning x 5  No dizziness    Head Nodding x 5  No dizziness    Pivot Right in Standing  No dizziness    Pivot Left in Standing  No dizziness    Rolling Right  No dizziness    Rolling Left  No dizziness  Objective measurements completed on examination: See above findings.              PT Education - 11/23/19 0919    Education Details  clinical findings, BPPV vs. hypofunction, PT POC and goals    Person(s) Educated  Patient    Methods  Explanation    Comprehension  Verbalized understanding          PT Long Term Goals - 11/23/19 1107      PT LONG TERM GOAL #1    Title  Pt will demonstrate compliance with vestibular and balance HEP    Time  8    Period  Weeks    Status  New    Target Date  01/22/20      PT LONG TERM GOAL #2   Title  Pt will report increase in overall function on FOTO to >/= 95% and continue to decrease DHI to 0    Baseline  93%, DHI: 4    Time  8    Period  Weeks    Status  New    Target Date  01/22/20      PT LONG TERM GOAL #3   Title  Pt will demonstrate improved use of VOR as indicated by 2-3 line difference on DVA    Baseline  5 line difference (9, 4)    Time  8    Period  Weeks    Status  New    Target Date  01/22/20      PT LONG TERM GOAL #4   Title  Pt will demonstrate 4 point improvement on FGA to indicated decreased falls risk in community    Baseline  TBD    Time  8    Period  Weeks    Status  New    Target Date  01/22/20             Plan - 11/23/19 0920    Clinical Impression Statement  Pt is a 49 year old female referred to Neuro OPPT for evaluation of vertigo.  Pt's PMH is significant for the following: anemia, DM, hyperthyroidism. The following deficits were noted during pt's exam: disequilibrium with intermittent true vertigo, impaired balance, impaired use of VOR with head impulse test positive to the left and 5 line difference on DVA indicating L vestibular hypofunction; pt was negative for nystagmus or vertigo during positional testing.  Pt would benefit from skilled PT to address these impairments and functional limitations to maximize functional mobility independence and reduce falls risk.    Personal Factors and Comorbidities  Comorbidity 3+;Fitness;Past/Current Experience    Comorbidities  anemia, DM, hyperthyroidism    Examination-Activity Limitations  Bed Mobility;Transfers;Caring for Others    Stability/Clinical Decision Making  Stable/Uncomplicated    Clinical Decision Making  Low    Rehab Potential  Excellent    PT Frequency  Biweekly    PT Duration  8 weeks    PT  Treatment/Interventions  ADLs/Self Care Home Management;Canalith Repostioning;Functional mobility training;Therapeutic activities;Therapeutic exercise;Balance training;Neuromuscular re-education;Vestibular    PT Next Visit Plan  FGA, initiate x1 viewing and balance HEP - pt only coming bi weekly    Consulted and Agree with Plan of Care  Patient       Patient will benefit from skilled therapeutic intervention in order to improve the following deficits and impairments:  Decreased balance, Dizziness  Visit Diagnosis: Dizziness and giddiness  Unsteadiness on feet     Problem List Patient Active Problem List  Diagnosis Date Noted  . Microalbuminuria 01/14/2019  . Hyperlipidemia LDL goal <70 01/14/2019  . Anemia 01/11/2019  . Well woman exam without gynecological exam 01/26/2018  . Irregular periods 01/26/2018  . 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 Anemia, iron deficiency 12/11/2017  . 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 Vitamin D deficiency 12/11/2017  . Graves disease 12/11/2017  . Vertigo 12/11/2017  . Type 2 diabetes mellitus without complication, without long-term current use of insulin (HCC) 12/01/2017    Dierdre Highman, PT, DPT 11/23/19    11:24 AM    [facility] [address] [place], [place], [address] Phone: [phone]   Fax:  [phone]  Name: Kaitlin Bowman MRN: [id number] Date of Birth: 06-15-1971

## 2019-12-01 ENCOUNTER — Other Ambulatory Visit: Payer: Self-pay

## 2019-12-01 ENCOUNTER — Ambulatory Visit: Payer: 59

## 2019-12-01 DIAGNOSIS — R42 Dizziness and giddiness: Secondary | ICD-10-CM | POA: Diagnosis not present

## 2019-12-01 DIAGNOSIS — R2681 Unsteadiness on feet: Secondary | ICD-10-CM

## 2019-12-01 NOTE — Therapy (Signed)
[facility] [address] [place], [place], [address] Phone: [phone]   Fax:  [phone]  Physical Therapy Treatment  Patient Details  Name: Kaitlin Bowman MRN: [id number] Date of Birth: Jul 04, 1971 Referring Provider (PT): Samuel Bouche, NP   Encounter Date: 12/01/2019  PT End of Session - 12/01/19 1842    Visit Number  2    Number of Visits  5    Date for PT Re-Evaluation  01/22/20    Authorization Type  UMR    PT Start Time  1748    PT Stop Time  1832    PT Time Calculation (min)  44 min    Equipment Utilized During Treatment  Other (comment)   min guard to S for safety as needed   Activity Tolerance  Patient tolerated treatment well    Behavior During Therapy  [facility] for tasks assessed/performed       Past Medical History:  Diagnosis Date  . Anemia   . Diabetes mellitus without complication ([place])   . Menorrhagia   . Thyroid disease    hyperthyroidism    Past Surgical History:  Procedure Laterality Date  . DILATION AND CURETTAGE OF UTERUS      There were no vitals filed for this visit.  Subjective Assessment - 12/01/19 1755    Subjective  Pt denied falls or changes since last visit.    Pertinent History  anemia, DM, hyperthyroidism    Currently in Pain?  No/denies         [facility] PT Assessment - 12/01/19 1756      Functional Gait  Assessment   Gait assessed   Yes    Gait Level Surface  Walks 20 ft in less than 5.5 sec, no assistive devices, good speed, no evidence for imbalance, normal gait pattern, deviates no more than 6 in outside of the 12 in walkway width.   4.38 sec.   Change in Gait Speed  Able to smoothly change walking speed without loss of balance or gait deviation. Deviate no more than 6 in outside of the 12 in walkway width.    Gait with Horizontal Head Turns  Performs head turns smoothly with no change in gait. Deviates no more than 6 in outside 12 in walkway width    Gait with Vertical  Head Turns  Performs head turns with no change in gait. Deviates no more than 6 in outside 12 in walkway width.    Gait and Pivot Turn  Pivot turns safely within 3 sec and stops quickly with no loss of balance.    Step Over Obstacle  Is able to step over 2 stacked shoe boxes taped together (9 in total height) without changing gait speed. No evidence of imbalance.    Gait with Narrow Base of Support  Is able to ambulate for 10 steps heel to toe with no staggering.    Gait with Eyes Closed  Walks 20 ft, uses assistive device, slower speed, mild gait deviations, deviates 6-10 in outside 12 in walkway width. Ambulates 20 ft in less than 9 sec but greater than 7 sec.   Slower speed and deviates   Ambulating Backwards  Walks 20 ft, uses assistive device, slower speed, mild gait deviations, deviates 6-10 in outside 12 in walkway width.    Steps  Alternating feet, no rail.    Total Score  28    FGA comment:  28/30: low falls risk.  Vestibular Treatment/Exercise - 12/01/19 1806      Vestibular Treatment/Exercise   Vestibular Treatment Provided  Gaze    Gaze Exercises  X1 Viewing Horizontal;X1 Viewing Vertical      X1 Viewing Horizontal   Foot Position  seated, standing with feet apart and together and on airex pad with feet apart/together.    Time  --   30   Reps  5    Comments  Cues and demo for proper technique. See HEP for details.       X1 Viewing Vertical   Foot Position  seated, standing with feet apart and together and on airex pad with feet apart/together.    Time  --   30 sec.    Reps  5    Comments  Cues and demo for proper technique. See HEP for details.          Balance Exercises - 12/01/19 1821      Balance Exercises: Standing   Standing Eyes Opened  Narrow base of support (BOS);Wide (BOA);Head turns;Foam/compliant surface;3 reps   30 sec. and 10 reps with turns   Standing Eyes Closed  Narrow base of support (BOS);Wide (BOA);Foam/compliant  surface;3 reps   10 reps/turns   Tandem Stance  Foam/compliant surface;Eyes open;2 reps   10 reps with head turns/nods   Other Standing Exercises  Performed in corner with chair in front of pt for safety. Cues and demo for proper technique. See HEP for details.        PT Education - 12/01/19 1840    Education Details  PT discussed FGA results. PT provided pt with vestibular and balance HEP. PT re-educated pt on the vestibular system and hypofunction, as she had more questions from last questions.    Person(s) Educated  Patient    Methods  Explanation    Comprehension  Verbalized understanding          PT Long Term Goals - 12/01/19 1846      PT LONG TERM GOAL #1   Title  Pt will demonstrate compliance with vestibular and balance HEP    Time  8    Period  Weeks    Status  New      PT LONG TERM GOAL #2   Title  Pt will report increase in overall function on FOTO to >/= 95% and continue to decrease DHI to 0    Baseline  93%, DHI: 4    Time  8    Period  Weeks    Status  New      PT LONG TERM GOAL #3   Title  Pt will demonstrate improved use of VOR as indicated by 2-3 line difference on DVA    Baseline  5 line difference (9, 4)    Time  8    Period  Weeks    Status  New      PT LONG TERM GOAL #4   Title  Pt will demonstrate 2 point improvement on FGA to indicated decreased falls risk in community    Baseline  28    Time  8    Period  Weeks    Status  Revised            Plan - 12/01/19 1843    Clinical Impression Statement  Pt's FGA score indicated pt is at a low risk for falls. Pt did experience incr. postural sway and unsteadiness during balance activities which require incr. vestibular input. PT provided pt with  x1 viewing and balance activities on compliant surfaces with head nods/turns and eyes closed to improve vestibular input. Pt would continue to benefit from skilled PT to improve safety during functional mobility.    Personal Factors and Comorbidities   Comorbidity 3+;Fitness;Past/Current Experience    Comorbidities  anemia, DM, hyperthyroidism    Examination-Activity Limitations  Bed Mobility;Transfers;Caring for Others    Stability/Clinical Decision Making  Stable/Uncomplicated    Rehab Potential  Excellent    PT Frequency  Biweekly    PT Duration  8 weeks    PT Treatment/Interventions  ADLs/Self Care Home Management;Canalith Repostioning;Functional mobility training;Therapeutic activities;Therapeutic exercise;Balance training;Neuromuscular re-education;Vestibular    PT Next Visit Plan  Review x1 viewing and balance HEP and progress as indicated. Rockerboard, compliant surface balance activities to improve vestibular input - pt only coming bi weekly    Consulted and Agree with Plan of Care  Patient       Patient will benefit from skilled therapeutic intervention in order to improve the following deficits and impairments:  Decreased balance, Dizziness  Visit Diagnosis: Dizziness and giddiness  Unsteadiness on feet     Problem List Patient Active Problem List   Diagnosis Date Noted  . Microalbuminuria 01/14/2019  . Hyperlipidemia LDL goal <70 01/14/2019  . Anemia 01/11/2019  . Well woman exam without gynecological exam 01/26/2018  . Irregular periods 01/26/2018  . 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 Anemia, iron deficiency 12/11/2017  . 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 Vitamin D deficiency 12/11/2017  . Graves disease 12/11/2017  . Vertigo 12/11/2017  . Type 2 diabetes mellitus without complication, without long-term current use of insulin ([facility]) 12/01/2017    Winefred Hillesheim L 12/01/2019, 6:46 PM   Zerita Boers, PT,DPT 12/01/19 6:47 PM Phone: [phone] Fax: [phone]    [facility] [address] [place], [place], [address] Phone: [phone]   Fax:  [phone]  Name: Hyacinth Marcelli MRN: [id number] Date of Birth: August 14, 1971

## 2019-12-01 NOTE — Patient Instructions (Signed)
Gaze Stabilization: Standing Feet Together (Compliant Surface)    Feet apart on pillow, keeping eyes on target on wall __2__ feet away with glasses off, tilt head down 15-30 and move head side to side for __30__ seconds. Repeat while moving head up and down for __30__ seconds. Do __2-3__ sessions per day.   Gaze Stabilization: Tip Card  1.Target must remain in focus, not blurry, and appear stationary while head is in motion. 2.Perform exercises with small head movements (45 to either side of midline). 3.Increase speed of head motion so long as target is in focus. 4.If you wear eyeglasses, be sure you can see target through lens (therapist will give specific instructions for bifocal / progressive lenses). 5.These exercises may provoke dizziness or nausea. Work through these symptoms. If too dizzy, slow head movement slightly. Rest between each exercise. 6.Exercises demand concentration; avoid distractions. 7.For safety, perform standing exercises close to a counter, wall, corner, or next to someone.  Copyright  VHI. All rights reserved.    Copyright  VHI. All rights reserved.   Feet Together (Compliant Surface) Head Motion - Eyes Closed    Stand on compliant surface: __pillow or cushion______ with feet together. Close eyes and hold steady for 30 seconds. Repeat _3___ times per session. Do __1__ sessions per day.  Copyright  VHI. All rights reserved.   Feet Heel-Toe "partial Tandem" (Compliant Surface) Head Motion - Eyes Closed    Stand on compliant surface: ____pillow or cushion____ with feet together (one foot slightly in front of the back foot). Close eyes and move head slowly, up and down 10 times and side to side 10 times.. Repeat __3__ times per session. Do __1__ sessions per day.  Copyright  VHI. All rights reserved.

## 2019-12-15 ENCOUNTER — Ambulatory Visit: Payer: 59 | Admitting: Physical Therapy

## 2019-12-15 ENCOUNTER — Other Ambulatory Visit: Payer: Self-pay

## 2019-12-15 DIAGNOSIS — R2681 Unsteadiness on feet: Secondary | ICD-10-CM

## 2019-12-15 DIAGNOSIS — R42 Dizziness and giddiness: Secondary | ICD-10-CM | POA: Diagnosis not present

## 2019-12-15 NOTE — Therapy (Addendum)
[facility] [address] [place], [place], [address] Phone: [phone]   Fax:  [phone]  Physical Therapy Treatment and Discharge Summary  Patient Details  Name: Kaitlin Bowman MRN: [id number] Date of Birth: 04-Feb-1971 Referring Provider (PT): Samuel Bouche, NP   Encounter Date: 12/15/2019  PT End of Session - 12/15/19 0858    Visit Number  3    Number of Visits  5    Date for PT Re-Evaluation  01/22/20    Authorization Type  UMR    PT Start Time  0805    PT Stop Time  0850    PT Time Calculation (min)  45 min    Equipment Utilized During Treatment  --    Activity Tolerance  Patient tolerated treatment well    Behavior During Therapy  [facility] for tasks assessed/performed       Past Medical History:  Diagnosis Date  . Anemia   . Diabetes mellitus without complication ([place])   . Menorrhagia   . Thyroid disease    hyperthyroidism    Past Surgical History:  Procedure Laterality Date  . DILATION AND CURETTAGE OF UTERUS      There were no vitals filed for this visit.  Subjective Assessment - 12/15/19 0851    Subjective  Today is final visit due to high payment and symptoms have improved.  Asking about most important exercises to perform.  Going to [place] later this year and asking how to prevent ears from stopping up.    Pertinent History  anemia, DM, hyperthyroidism    Currently in Pain?  No/denies         [facility] PT Assessment - 12/15/19 0853      Observation/Other Assessments   Focus on Therapeutic Outcomes (FOTO)   93% function    Other Surveys   Dizziness Handicap Inventory [facility])    Dizziness Handicap Inventory [facility])   2                   [facility] Adult PT Treatment/Exercise - 12/15/19 0853      Therapeutic Activites    Therapeutic Activities  Other Therapeutic Activities    Other Therapeutic Activities  Unable to give pt specific ways to prevent ear pressure during flight other than typical  methods of chewing gum, opening/closing mouth, blow through closed nose and mouth.  Educated pt on purpose of various vestibular exercises and goal of adaptation and habituation to improve recovery time and ability to tolerate variety of environments and activities.  Provided pt with 3 most important exercises to continue to perform for gaze adaptation, multi sensory balance training and habituation.  Also recommended pt continue walking for aerobic endurance, yoga for mobility and balance for general health and wellness.        Vestibular Treatment/Exercise - 12/15/19 7741      Vestibular Treatment/Exercise   Vestibular Treatment Provided  Habituation    Habituation Exercises  Legrand Como Daroff   Number of Reps   3    Symptom Description   mild symptoms going from L sidelying to sitting       Gaze Stabilization: Standing Feet Together (Compliant Surface)    Feet apart on pillow, keeping eyes on target on wall __2__ feet away with glasses off, tilt head down 15-30 and move head side to side for __30__ seconds. Repeat while moving head up and down for __30__ seconds. Do __2-3__ sessions per day.  Feet Heel-Toe "partial Tandem" (Compliant Surface) Head Motion - Eyes Closed    Stand on compliant surface: ____pillow or cushion____ with feet together (one foot slightly in front of the back foot). Close eyes and move head slowly, up and down 10 times and side to side 10 times.. Repeat __3__ times per session. Do __1__ sessions per day.    Habituation - Sit to Side-Lying   Sit on edge of bed. Lie down onto the right side and hold until dizziness stops, plus 20 seconds.  Return to sitting and wait until dizziness stops, plus 20 seconds.  Repeat to the left side. Repeat sequence 5 times per session. Do 2 sessions per day.  Copyright  VHI. All rights reserved.            PT Education - 12/15/19 0857    Education Details  see TA; final HEP recommendations, D/C  today    Person(s) Educated  Patient    Methods  Explanation    Comprehension  Verbalized understanding          PT Long Term Goals - 12/15/19 0859      PT LONG TERM GOAL #1   Title  Pt will demonstrate compliance with vestibular and balance HEP    Time  8    Period  Weeks    Status  Achieved      PT LONG TERM GOAL #2   Title  Pt will report increase in overall function on FOTO to >/= 95% and continue to decrease DHI to 0    Baseline  93%, DHI: 4 > FOTO remained the same, DHI decreased to 2    Time  8    Period  Weeks    Status  Partially Met      PT LONG TERM GOAL #3   Title  Pt will demonstrate improved use of VOR as indicated by 2-3 line difference on DVA    Baseline  5 line difference (9, 4)    Time  8    Period  Weeks    Status  Unable to assess      PT LONG TERM GOAL #4   Title  Pt will demonstrate 2 point improvement on FGA to indicated decreased falls risk in community    Baseline  28    Time  8    Period  Weeks    Status  Unable to assess            Plan - 12/15/19 0900    Clinical Impression Statement  Pt requesting to let this be her final therapy session due to financial concerns.  Due to low number of visits, FGA and DVA not retested due to not enough time to see change.  FOTO remained unchanged but was [facility], DHI decreased by 2 points but continued to be low.  Pt requested to focus on most important exercises to perform - provided pt with HEP for balance, adaptation, habituation and aerobic conditioning.  Pt feels safe and ready for D/C.    Personal Factors and Comorbidities  Comorbidity 3+;Fitness;Past/Current Experience    Comorbidities  anemia, DM, hyperthyroidism    Examination-Activity Limitations  Bed Mobility;Transfers;Caring for Others    Stability/Clinical Decision Making  Stable/Uncomplicated    Rehab Potential  Excellent    PT Frequency  Biweekly    PT Duration  8 weeks    PT Treatment/Interventions  ADLs/Self Care Home Management;Canalith  Repostioning;Functional mobility training;Therapeutic activities;Therapeutic exercise;Balance training;Neuromuscular re-education;Vestibular    PT  Next Visit Plan  D/C    Consulted and Agree with Plan of Care  Patient       Patient will benefit from skilled therapeutic intervention in order to improve the following deficits and impairments:  Decreased balance, Dizziness  Visit Diagnosis: Dizziness and giddiness  Unsteadiness on feet     Problem List Patient Active Problem List   Diagnosis Date Noted  . Microalbuminuria 01/14/2019  . Hyperlipidemia LDL goal <70 01/14/2019  . Anemia 01/11/2019  . Well woman exam without gynecological exam 01/26/2018  . Irregular periods 01/26/2018  . 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 Anemia, iron deficiency 12/11/2017  . 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 Vitamin D deficiency 12/11/2017  . Graves disease 12/11/2017  . Vertigo 12/11/2017  . Type 2 diabetes mellitus without complication, without long-term current use of insulin ([place]) 12/01/2017    PHYSICAL THERAPY DISCHARGE SUMMARY  Visits from Start of Care: 3  Current functional level related to goals / functional outcomes: See impression statement and LTG achievement above.   Remaining deficits: Mild motion sensitivity, impaired VOR   Education / Equipment: HEP  Plan: Patient agrees to discharge.  Patient goals were partially met. Patient is being discharged due to financial reasons.  ?????     Rico Junker, PT, DPT 12/15/19    9:04 AM    [facility] [address] [place], [place], [address] Phone: [phone]   Fax:  [phone]  Name: Kaitlin Bowman MRN: [id number] Date of Birth: Sep 09, 1970

## 2019-12-15 NOTE — Patient Instructions (Addendum)
Gaze Stabilization: Standing Feet Together (Compliant Surface)    Feet apart on pillow, keeping eyes on target on wall __2__ feet away with glasses off, tilt head down 15-30 and move head side to side for __30__ seconds. Repeat while moving head up and down for __30__ seconds. Do __2-3__ sessions per day.    Feet Heel-Toe "partial Tandem" (Compliant Surface) Head Motion - Eyes Closed    Stand on compliant surface: ____pillow or cushion____ with feet together (one foot slightly in front of the back foot). Close eyes and move head slowly, up and down 10 times and side to side 10 times.. Repeat __3__ times per session. Do __1__ sessions per day.    Habituation - Sit to Side-Lying   Sit on edge of bed. Lie down onto the right side and hold until dizziness stops, plus 20 seconds.  Return to sitting and wait until dizziness stops, plus 20 seconds.  Repeat to the left side. Repeat sequence 5 times per session. Do 2 sessions per day.  Copyright  VHI. All rights reserved.

## 2020-01-03 ENCOUNTER — Other Ambulatory Visit: Payer: Self-pay | Admitting: Physician Assistant

## 2020-01-03 DIAGNOSIS — Z1231 Encounter for screening mammogram for malignant neoplasm of breast: Secondary | ICD-10-CM

## 2020-02-06 ENCOUNTER — Ambulatory Visit
Admission: RE | Admit: 2020-02-06 | Discharge: 2020-02-06 | Disposition: A | Discharge status: Home / Self Care | Payer: 59 | Source: Ambulatory Visit | Attending: Physician Assistant | Admitting: Physician Assistant

## 2020-02-06 ENCOUNTER — Other Ambulatory Visit: Payer: Self-pay

## 2020-02-06 DIAGNOSIS — Z1231 Encounter for screening mammogram for malignant neoplasm of breast: Secondary | ICD-10-CM

## 2020-02-08 NOTE — Progress Notes (Signed)
Normal mammogram. Follow up in one year.

## 2020-02-23 ENCOUNTER — Telehealth: Payer: Self-pay

## 2020-02-23 DIAGNOSIS — E119 Type 2 diabetes mellitus without complications: Secondary | ICD-10-CM

## 2020-02-23 DIAGNOSIS — E785 Hyperlipidemia, unspecified: Secondary | ICD-10-CM

## 2020-02-23 DIAGNOSIS — I10 Essential (primary) hypertension: Secondary | ICD-10-CM

## 2020-02-23 NOTE — Telephone Encounter (Signed)
Hye requested labs.

## 2020-03-07 ENCOUNTER — Other Ambulatory Visit (HOSPITAL_COMMUNITY)
Admission: RE | Admit: 2020-03-07 | Discharge: 2020-03-07 | Disposition: A | Discharge status: Home / Self Care | Payer: 59 | Source: Ambulatory Visit | Attending: Physician Assistant | Admitting: Physician Assistant

## 2020-03-07 DIAGNOSIS — E119 Type 2 diabetes mellitus without complications: Secondary | ICD-10-CM | POA: Diagnosis not present

## 2020-03-07 DIAGNOSIS — I10 Essential (primary) hypertension: Secondary | ICD-10-CM | POA: Diagnosis not present

## 2020-03-07 DIAGNOSIS — E785 Hyperlipidemia, unspecified: Secondary | ICD-10-CM | POA: Diagnosis not present

## 2020-03-07 LAB — COMPREHENSIVE METABOLIC PANEL
ALT: 23 U/L (ref 0–44)
AST: 22 U/L (ref 15–41)
Albumin: 4.1 g/dL (ref 3.5–5.0)
Alkaline Phosphatase: 53 U/L (ref 38–126)
Anion gap: 8 (ref 5–15)
BUN: 10 mg/dL (ref 6–20)
CO2: 27 mmol/L (ref 22–32)
Calcium: 9.2 mg/dL (ref 8.9–10.3)
Chloride: 103 mmol/L (ref 98–111)
Creatinine, Ser: 0.67 mg/dL (ref 0.44–1.00)
GFR calc Af Amer: >60 mL/min (ref >60)
GFR calc non Af Amer: >60 mL/min (ref >60)
Glucose, Bld: 135 mg/dL — ABNORMAL HIGH (ref 70–99)
Potassium: 3.8 mmol/L (ref 3.5–5.1)
Sodium: 138 mmol/L (ref 135–145)
Total Bilirubin: 0.6 mg/dL (ref 0.3–1.2)
Total Protein: 7.8 g/dL (ref 6.5–8.1)

## 2020-03-07 LAB — CBC
HCT: 43 % (ref 36.0–46.0)
Hemoglobin: 13.6 g/dL (ref 12.0–15.0)
MCH: 28.4 pg (ref 26.0–34.0)
MCHC: 31.6 g/dL (ref 30.0–36.0)
MCV: 89.8 fL (ref 80.0–100.0)
Platelets: 377 10*3/uL (ref 150–400)
RBC: 4.79 MIL/uL (ref 3.87–5.11)
RDW: 13 % (ref 11.5–15.5)
WBC: 6.5 10*3/uL (ref 4.0–10.5)
nRBC: 0 % (ref 0.0–0.2)

## 2020-03-07 NOTE — Telephone Encounter (Signed)
Patient reports that she had her blood work completed today at the hospital and she was under the impression that you would have drawn a A1C test and all that was completed was CBC and CMP. Does she need an A1C added on to her labs or another lab draw? Please advise.

## 2020-03-07 NOTE — Telephone Encounter (Signed)
It looks like A1C and Lipid panel were ordered, but it states cancelled because "appropriate specimen not received".

## 2020-03-07 NOTE — Telephone Encounter (Signed)
Ok to add

## 2020-03-08 ENCOUNTER — Encounter: Payer: Self-pay | Admitting: Physician Assistant

## 2020-03-08 NOTE — Telephone Encounter (Signed)
Do we need to add an A1C since patient is wanting this checked? Please advise.

## 2020-03-09 ENCOUNTER — Ambulatory Visit (INDEPENDENT_AMBULATORY_CARE_PROVIDER_SITE_OTHER): Payer: 59 | Admitting: Physician Assistant

## 2020-03-09 ENCOUNTER — Other Ambulatory Visit (HOSPITAL_COMMUNITY): Payer: Self-pay | Admitting: Family Medicine

## 2020-03-09 VITALS — BP 138/86 | HR 86 | Ht 63.0 in | Wt 139.0 lb

## 2020-03-09 DIAGNOSIS — E119 Type 2 diabetes mellitus without complications: Secondary | ICD-10-CM

## 2020-03-09 DIAGNOSIS — Z0000 Encounter for general adult medical examination without abnormal findings: Secondary | ICD-10-CM | POA: Diagnosis not present

## 2020-03-09 DIAGNOSIS — Z13220 Encounter for screening for lipoid disorders: Secondary | ICD-10-CM

## 2020-03-09 DIAGNOSIS — R002 Palpitations: Secondary | ICD-10-CM

## 2020-03-09 DIAGNOSIS — Z1159 Encounter for screening for other viral diseases: Secondary | ICD-10-CM | POA: Diagnosis not present

## 2020-03-09 DIAGNOSIS — Z1329 Encounter for screening for other suspected endocrine disorder: Secondary | ICD-10-CM

## 2020-03-09 LAB — POCT UA - MICROALBUMIN
Albumin/Creatinine Ratio, Urine, POC: <30
Creatinine, POC: 100 mg/dL
Microalbumin Ur, POC: 10 mg/L

## 2020-03-09 MED ORDER — AMBULATORY NON FORMULARY MEDICATION: 3 refills | Status: DC

## 2020-03-09 NOTE — Telephone Encounter (Signed)
Patient states this has been resolved.

## 2020-03-09 NOTE — Patient Instructions (Signed)
Health Maintenance, Female Adopting a healthy lifestyle and getting preventive care are important in promoting health and wellness. Ask your health care provider about:  The right schedule for you to have regular tests and exams.  Things you can do on your own to prevent diseases and keep yourself healthy. What should I know about diet, weight, and exercise? Eat a healthy diet   Eat a diet that includes plenty of vegetables, fruits, low-fat dairy products, and lean protein.  Do not eat a lot of foods that are high in solid fats, added sugars, or sodium. Maintain a healthy weight Body mass index (BMI) is used to identify weight problems. It estimates body fat based on height and weight. Your health care provider can help determine your BMI and help you achieve or maintain a healthy weight. Get regular exercise Get regular exercise. This is one of the most important things you can do for your health. Most adults should:  Exercise for at least 150 minutes each week. The exercise should increase your heart rate and make you sweat (moderate-intensity exercise).  Do strengthening exercises at least twice a week. This is in addition to the moderate-intensity exercise.  Spend less time sitting. Even light physical activity can be beneficial. Watch cholesterol and blood lipids Have your blood tested for lipids and cholesterol at 50 years of age, then have this test every 5 years. Have your cholesterol levels checked more often if:  Your lipid or cholesterol levels are high.  You are older than 50 years of age.  You are at high risk for heart disease. What should I know about cancer screening? Depending on your health history and family history, you may need to have cancer screening at various ages. This may include screening for:  Breast cancer.  Cervical cancer.  Colorectal cancer.  Skin cancer.  Lung cancer. What should I know about heart disease, diabetes, and high blood  pressure? Blood pressure and heart disease  High blood pressure causes heart disease and increases the risk of stroke. This is more likely to develop in people who have high blood pressure readings, are of African descent, or are overweight.  Have your blood pressure checked: ? Every 3-5 years if you are 18-39 years of age. ? Every year if you are 40 years old or older. Diabetes Have regular diabetes screenings. This checks your fasting blood sugar level. Have the screening done:  Once every three years after age 40 if you are at a normal weight and have a low risk for diabetes.  More often and at a younger age if you are overweight or have a high risk for diabetes. What should I know about preventing infection? Hepatitis B If you have a higher risk for hepatitis B, you should be screened for this virus. Talk with your health care provider to find out if you are at risk for hepatitis B infection. Hepatitis C Testing is recommended for:  Everyone born from 1945 through 1965.  Anyone with known risk factors for hepatitis C. Sexually transmitted infections (STIs)  Get screened for STIs, including gonorrhea and chlamydia, if: ? You are sexually active and are younger than 50 years of age. ? You are older than 50 years of age and your health care provider tells you that you are at risk for this type of infection. ? Your sexual activity has changed since you were last screened, and you are at increased risk for chlamydia or gonorrhea. Ask your health care provider if   you are at risk.  Ask your health care provider about whether you are at high risk for HIV. Your health care provider may recommend a prescription medicine to help prevent HIV infection. If you choose to take medicine to prevent HIV, you should first get tested for HIV. You should then be tested every 3 months for as long as you are taking the medicine. Pregnancy  If you are about to stop having your period (premenopausal) and  you may become pregnant, seek counseling before you get pregnant.  Take 400 to 800 micrograms (mcg) of folic acid every day if you become pregnant.  Ask for birth control (contraception) if you want to prevent pregnancy. Osteoporosis and menopause Osteoporosis is a disease in which the bones lose minerals and strength with aging. This can result in bone fractures. If you are 65 years old or older, or if you are at risk for osteoporosis and fractures, ask your health care provider if you should:  Be screened for bone loss.  Take a calcium or vitamin D supplement to lower your risk of fractures.  Be given hormone replacement therapy (HRT) to treat symptoms of menopause. Follow these instructions at home: Lifestyle  Do not use any products that contain nicotine or tobacco, such as cigarettes, e-cigarettes, and chewing tobacco. If you need help quitting, ask your health care provider.  Do not use street drugs.  Do not share needles.  Ask your health care provider for help if you need support or information about quitting drugs. Alcohol use  Do not drink alcohol if: ? Your health care provider tells you not to drink. ? You are pregnant, may be pregnant, or are planning to become pregnant.  If you drink alcohol: ? Limit how much you use to 0-1 drink a day. ? Limit intake if you are breastfeeding.  Be aware of how much alcohol is in your drink. In the [place] one drink equals one 12 oz bottle of beer (355 mL), one 5 oz glass of wine (148 mL), or one 1 oz glass of hard liquor (44 mL). General instructions  Schedule regular health, dental, and eye exams.  Stay current with your vaccines.  Tell your health care provider if: ? You often feel depressed. ? You have ever been abused or do not feel safe at home. Summary  Adopting a healthy lifestyle and getting preventive care are important in promoting health and wellness.  Follow your health care provider's instructions about healthy  diet, exercising, and getting tested or screened for diseases.  Follow your health care provider's instructions on monitoring your cholesterol and blood pressure. This information is not intended to replace advice given to you by your health care provider. Make sure you discuss any questions you have with your health care provider. Document Revised: 07/28/2018 Document Reviewed: 07/28/2018 Elsevier Patient Education  2020 [employer]vier Inc[employer]  

## 2020-03-09 NOTE — Progress Notes (Signed)
Patient ID: Kaitlin Bowman, female   DOB: 1970-12-05, 49 y.o.   MRN: [id number] Subjective:     Kaitlin Bowman is a 49 y.o. female and is here for a comprehensive physical exam. The patient reports problems - see below.    She mentions a few episodes of palpitations. Usually occur at night. No CP, headaches, dizziness. Last 2-5 seconds. No known trigger.   Social History   Socioeconomic History  . Marital status: Single    Spouse name: Not on file  . Number of children: Not on file  . Years of education: Not on file  . Highest education level: Not on file  Occupational History  . Not on file  Tobacco Use  . Smoking status: Never Smoker  . Smokeless tobacco: Never Used  Vaping Use  . Vaping Use: Never used  Substance and Sexual Activity  . Alcohol use: No  . Drug use: No  . Sexual activity: Not Currently  Other Topics Concern  . Not on file  Social History Narrative  . Not on file   Social Determinants of Health   Financial Resource Strain:   . Difficulty of Paying Living Expenses:   Food Insecurity:   . Worried About Programme researcher, broadcasting/film/video in the Last Year:   . Barista in the Last Year:   Transportation Needs:   . Freight forwarder (Medical):   Marland Kitchen Lack of Transportation (Non-Medical):   Physical Activity:   . Days of Exercise per Week:   . Minutes of Exercise per Session:   Stress:   . Feeling of Stress :   Social Connections:   . Frequency of Communication with Friends and Family:   . Frequency of Social Gatherings with Friends and Family:   . Attends Religious Services:   . Active Member of Clubs or Organizations:   . Attends Banker Meetings:   Marland Kitchen Marital Status:   Intimate Partner Violence:   . Fear of Current or Ex-Partner:   . Emotionally Abused:   Marland Kitchen Physically Abused:   . Sexually Abused:    Health Maintenance  Topic Date Due  . Hepatitis C Screening  Never done  . HEMOGLOBIN A1C  02/01/2020  . OPHTHALMOLOGY EXAM  05/10/2020  (Originally 03/18/2018)  . PNEUMOCOCCAL POLYSACCHARIDE VACCINE AGE 63-64 HIGH RISK  03/09/2021 (Originally 03/22/1972)  . HIV Screening  03/09/2021 (Originally 03/22/1985)  . INFLUENZA VACCINE  03/18/2020  . MAMMOGRAM  02/05/2021  . FOOT EXAM  03/09/2021  . URINE MICROALBUMIN  03/09/2021  . PAP SMEAR-Modifier  02/28/2022  . TETANUS/TDAP  03/30/2023  . COVID-19 Vaccine  Completed    The following portions of the patient's history were reviewed and updated as appropriate: allergies, current medications, past family history, past medical history, past social history, past surgical history and problem list.  Review of Systems A comprehensive review of systems was negative.   Objective:    BP (!) 138/86   Pulse 86   Ht 5\' 3"  (1.6 m)   Wt 139 lb (63 kg)   SpO2 99%   BMI 24.62 kg/m  General appearance: alert, cooperative and appears stated age Head: Normocephalic, without obvious abnormality, atraumatic Eyes: conjunctivae/corneas clear. PERRL, EOM's intact. Fundi benign. Ears: normal TM's and external ear canals both ears Nose: Nares normal. Septum midline. Mucosa normal. No drainage or sinus tenderness. Throat: lips, mucosa, and tongue normal; teeth and gums normal Neck: no adenopathy, no carotid bruit, no JVD, supple, symmetrical, trachea midline  and thyroid not enlarged, symmetric, no tenderness/mass/nodules Back: symmetric, no curvature. ROM normal. No CVA tenderness. Lungs: clear to auscultation bilaterally Heart: regular rate and rhythm, S1, S2 normal, no murmur, click, rub or gallop Abdomen: soft, non-tender; bowel sounds normal; no masses,  no organomegaly Extremities: extremities normal, atraumatic, no cyanosis or edema Pulses: 2+ and symmetric Skin: Skin color, texture, turgor normal. No rashes or lesions Lymph nodes: Cervical, supraclavicular, and axillary nodes normal. Neurologic: Alert and oriented X 3, normal strength and tone. Normal symmetric reflexes. Normal coordination  and gait    Assessment:    Healthy female exam.      Plan:    Marland KitchenMarland KitchenWestley Bowman was seen today for annual exam.  Diagnoses and all orders for this visit:  Routine physical examination  Type 2 diabetes mellitus without complication, without long-term current use of insulin (HCC) -     POCT UA - Microalbumin -     Hemoglobin A1c  Thyroid disorder screen -     TSH  Screening for lipid disorders -     Lipid Panel w/reflex Direct LDL  Encounter for hepatitis C screening test for low risk patient -     Hepatitis C Antibody  Other orders -     Discontinue: AMBULATORY NON FORMULARY MEDICATION; Freestyle lite test strips and lancets for T2DM to test three times a day. -     AMBULATORY NON FORMULARY MEDICATION; Freestyle lite METER,  test strips and lancets for T2DM to test three times a day.   Discussed 150 minutes of exercise a week.  Encouraged vitamin D 1000 units and Calcium 1300mg  or 4 servings of dairy a day.  Ordered more fasting labs to review.  Pt declines colonoscopy until next year.  Pt declines vaccines.  Needs eye exam.  Mammogram UTD.  Pap UTD.   Discussed palpitations. HO given for triggers.avoid caffeine. Work on stress. Follow up as needed for further evaluation.   See After Visit Summary for Counseling Recommendations

## 2020-03-12 ENCOUNTER — Encounter: Payer: Self-pay | Admitting: Physician Assistant

## 2020-03-12 DIAGNOSIS — R002 Palpitations: Secondary | ICD-10-CM | POA: Insufficient documentation

## 2020-03-14 ENCOUNTER — Other Ambulatory Visit: Payer: Self-pay

## 2020-03-14 ENCOUNTER — Other Ambulatory Visit (HOSPITAL_COMMUNITY)
Admission: AD | Admit: 2020-03-14 | Discharge: 2020-03-14 | Disposition: A | Discharge status: Home / Self Care | Payer: 59 | Source: Ambulatory Visit | Attending: Physician Assistant | Admitting: Physician Assistant

## 2020-03-14 DIAGNOSIS — E785 Hyperlipidemia, unspecified: Secondary | ICD-10-CM | POA: Diagnosis not present

## 2020-03-14 DIAGNOSIS — I10 Essential (primary) hypertension: Secondary | ICD-10-CM | POA: Diagnosis not present

## 2020-03-14 DIAGNOSIS — Z1329 Encounter for screening for other suspected endocrine disorder: Secondary | ICD-10-CM

## 2020-03-14 DIAGNOSIS — E119 Type 2 diabetes mellitus without complications: Secondary | ICD-10-CM | POA: Diagnosis not present

## 2020-03-14 DIAGNOSIS — Z1159 Encounter for screening for other viral diseases: Secondary | ICD-10-CM

## 2020-03-14 LAB — LIPID PANEL
Cholesterol: 193 mg/dL (ref 0–200)
HDL: 65 mg/dL (ref >40)
LDL Cholesterol: 117 mg/dL — ABNORMAL HIGH (ref 0–99)
Total CHOL/HDL Ratio: 3 RATIO
Triglycerides: 57 mg/dL (ref <150)
VLDL: 11 mg/dL (ref 0–40)

## 2020-03-14 LAB — HEMOGLOBIN A1C
Hgb A1c MFr Bld: 6.5 % — ABNORMAL HIGH (ref 4.8–5.6)
Mean Plasma Glucose: 139.85 mg/dL

## 2020-03-14 LAB — HEPATITIS C ANTIBODY: HCV Ab: NONREACTIVE

## 2020-03-14 LAB — TSH: TSH: 2.772 u[IU]/mL (ref 0.350–4.500)

## 2020-03-19 NOTE — Progress Notes (Signed)
Kaitlin Bowman,   Happy Early Birthday.   A1C is 6.5. to goal but could improve. For now stay on same medications.  Thyroid is perfect.  LDL is not to goal to have diabetes. Goal is under 70 and you are 89. I would strongly consider statin to decrease your overall CV risk. HDL is GREAT.

## 2020-03-23 ENCOUNTER — Other Ambulatory Visit: Payer: Self-pay | Admitting: Physician Assistant

## 2020-03-23 MED ORDER — ATORVASTATIN CALCIUM 20 MG PO TABS: 20.0000 mg | ORAL_TABLET | Freq: Every day | ORAL | 3 refills | Status: DC

## 2020-05-08 DIAGNOSIS — H00022 Hordeolum internum right lower eyelid: Secondary | ICD-10-CM | POA: Diagnosis not present

## 2020-05-08 LAB — HM DIABETES EYE EXAM

## 2020-06-01 ENCOUNTER — Other Ambulatory Visit: Payer: Self-pay

## 2020-06-01 ENCOUNTER — Other Ambulatory Visit: Payer: 59

## 2020-06-01 DIAGNOSIS — Z20822 Contact with and (suspected) exposure to covid-19: Secondary | ICD-10-CM

## 2020-06-02 DIAGNOSIS — Z1152 Encounter for screening for COVID-19: Secondary | ICD-10-CM | POA: Diagnosis not present

## 2020-06-02 LAB — SARS-COV-2, NAA 2 DAY TAT

## 2020-06-02 LAB — SPECIMEN STATUS REPORT

## 2020-06-02 LAB — NOVEL CORONAVIRUS, NAA: SARS-CoV-2, NAA: NOT DETECTED

## 2020-06-04 ENCOUNTER — Telehealth: Payer: Self-pay

## 2020-06-04 ENCOUNTER — Encounter: Payer: Self-pay | Admitting: Physician Assistant

## 2020-06-04 NOTE — Telephone Encounter (Signed)
Letter written and available in EMR.

## 2020-06-04 NOTE — Telephone Encounter (Signed)
Ok to write up and leave in communication in her EMR as long as we have proof of negative test.

## 2020-06-04 NOTE — Telephone Encounter (Signed)
Patient is scheduled to travel on Wednesday.  She had to have a COVID test which was negative.  The country she is traveling to requires the following: Patient Name & DOB; test type, date, and result; Ordering hospital.  Patient would like to get this from MyChart later today or tomorrow.

## 2020-07-02 DIAGNOSIS — Z20828 Contact with and (suspected) exposure to other viral communicable diseases: Secondary | ICD-10-CM | POA: Diagnosis not present

## 2020-08-28 ENCOUNTER — Other Ambulatory Visit: Payer: Self-pay | Admitting: Physician Assistant

## 2020-08-28 DIAGNOSIS — E119 Type 2 diabetes mellitus without complications: Secondary | ICD-10-CM

## 2020-08-30 ENCOUNTER — Telehealth: Payer: Self-pay

## 2020-08-30 DIAGNOSIS — E785 Hyperlipidemia, unspecified: Secondary | ICD-10-CM

## 2020-08-30 DIAGNOSIS — E119 Type 2 diabetes mellitus without complications: Secondary | ICD-10-CM

## 2020-08-30 DIAGNOSIS — Z79899 Other long term (current) drug therapy: Secondary | ICD-10-CM

## 2020-08-30 NOTE — Telephone Encounter (Signed)
Pt has called to request a prescription for lab blood work to be done prior before coming into the office for her upcoming  appt.

## 2020-09-03 NOTE — Addendum Note (Signed)
Addended by: Jomarie Longs on: 09/03/2020 08:35 AM   Modules accepted: Orders

## 2020-09-03 NOTE — Telephone Encounter (Signed)
Orders have been placed. Make sure patient goes to lab fasting for at least 8 hours.

## 2020-09-04 NOTE — Telephone Encounter (Signed)
routing

## 2020-09-04 NOTE — Telephone Encounter (Signed)
Spoke to the pt  She wants a prescription for her lab work to be sent to her home so she can choose what lab to go to she prefer to have it done at [facility].

## 2020-09-04 NOTE — Telephone Encounter (Signed)
Faxed to [phone]

## 2020-09-04 NOTE — Telephone Encounter (Signed)
Patient is going to call us back with the fax number to her unit. We can fax an order to the unit.

## 2020-09-04 NOTE — Telephone Encounter (Signed)
Ok you will need to find out what lab they use at [facility] and you can print the labs I ordered and get them to patient.

## 2020-09-05 ENCOUNTER — Encounter: Payer: Self-pay | Admitting: Physician Assistant

## 2020-09-07 ENCOUNTER — Telehealth: Payer: Self-pay | Admitting: Neurology

## 2020-09-07 NOTE — Telephone Encounter (Signed)
Patient left vm stating she wants her lab order mailed to her for her appt. Lab order printed and mailed to patient per her request.

## 2020-09-10 ENCOUNTER — Ambulatory Visit: Payer: 59 | Admitting: Physician Assistant

## 2020-09-18 DIAGNOSIS — E119 Type 2 diabetes mellitus without complications: Secondary | ICD-10-CM | POA: Diagnosis not present

## 2020-09-18 DIAGNOSIS — Z79899 Other long term (current) drug therapy: Secondary | ICD-10-CM | POA: Diagnosis not present

## 2020-09-18 DIAGNOSIS — E785 Hyperlipidemia, unspecified: Secondary | ICD-10-CM | POA: Diagnosis not present

## 2020-09-19 LAB — COMPLETE METABOLIC PANEL WITH GFR
AG Ratio: 1.5 (calc) (ref 1.0–2.5)
ALT: 9 U/L (ref 6–29)
AST: 15 U/L (ref 10–35)
Albumin: 4.2 g/dL (ref 3.6–5.1)
Alkaline phosphatase (APISO): 50 U/L (ref 37–153)
BUN: 11 mg/dL (ref 7–25)
CO2: 26 mmol/L (ref 20–32)
Calcium: 8.9 mg/dL (ref 8.6–10.4)
Chloride: 105 mmol/L (ref 98–110)
Creat: 0.66 mg/dL (ref 0.50–1.05)
GFR, Est African American: 119 mL/min/{1.73_m2} (ref >=60)
GFR, Est Non African American: 103 mL/min/{1.73_m2} (ref >=60)
Globulin: 2.8 g/dL (calc) (ref 1.9–3.7)
Glucose, Bld: 104 mg/dL — ABNORMAL HIGH (ref 65–99)
Potassium: 4 mmol/L (ref 3.5–5.3)
Sodium: 138 mmol/L (ref 135–146)
Total Bilirubin: 0.5 mg/dL (ref 0.2–1.2)
Total Protein: 7 g/dL (ref 6.1–8.1)

## 2020-09-19 LAB — LIPID PANEL W/REFLEX DIRECT LDL
Cholesterol: 175 mg/dL (ref <200)
HDL: 68 mg/dL (ref >=50)
LDL Cholesterol (Calc): 93 mg/dL (calc)
Non-HDL Cholesterol (Calc): 107 mg/dL (calc) (ref <130)
Total CHOL/HDL Ratio: 2.6 (calc) (ref <5.0)
Triglycerides: 55 mg/dL (ref <150)

## 2020-09-19 LAB — HEMOGLOBIN A1C
Hgb A1c MFr Bld: 6.6 % of total Hgb — ABNORMAL HIGH (ref <5.7)
Mean Plasma Glucose: 143 mg/dL
eAG (mmol/L): 7.9 mmol/L

## 2020-09-19 NOTE — Telephone Encounter (Signed)
A1C up just a little more than 6 months ago. Under 7.0 so consider controlled. We could consider adding a medication to your metformin. Will discuss at next appt.

## 2020-09-25 ENCOUNTER — Ambulatory Visit: Payer: 59 | Admitting: Physician Assistant

## 2020-10-01 ENCOUNTER — Encounter: Payer: Self-pay | Admitting: Physician Assistant

## 2020-10-01 ENCOUNTER — Other Ambulatory Visit: Payer: Self-pay

## 2020-10-01 ENCOUNTER — Ambulatory Visit: Payer: 59 | Admitting: Physician Assistant

## 2020-10-01 ENCOUNTER — Ambulatory Visit (INDEPENDENT_AMBULATORY_CARE_PROVIDER_SITE_OTHER): Payer: 59

## 2020-10-01 ENCOUNTER — Other Ambulatory Visit: Payer: Self-pay | Admitting: Physician Assistant

## 2020-10-01 VITALS — BP 137/97 | HR 84 | Ht 63.0 in | Wt 139.0 lb

## 2020-10-01 DIAGNOSIS — E785 Hyperlipidemia, unspecified: Secondary | ICD-10-CM | POA: Diagnosis not present

## 2020-10-01 DIAGNOSIS — E119 Type 2 diabetes mellitus without complications: Secondary | ICD-10-CM

## 2020-10-01 DIAGNOSIS — M47816 Spondylosis without myelopathy or radiculopathy, lumbar region: Secondary | ICD-10-CM | POA: Insufficient documentation

## 2020-10-01 DIAGNOSIS — M545 Low back pain, unspecified: Secondary | ICD-10-CM | POA: Diagnosis not present

## 2020-10-01 DIAGNOSIS — Z1211 Encounter for screening for malignant neoplasm of colon: Secondary | ICD-10-CM | POA: Diagnosis not present

## 2020-10-01 DIAGNOSIS — M25512 Pain in left shoulder: Secondary | ICD-10-CM

## 2020-10-01 DIAGNOSIS — G8929 Other chronic pain: Secondary | ICD-10-CM | POA: Diagnosis not present

## 2020-10-01 MED ORDER — DICLOFENAC SODIUM 1 % EX GEL: 4.0000 g | Freq: Four times a day (QID) | CUTANEOUS | 1 refills | Status: DC

## 2020-10-01 MED ORDER — ROSUVASTATIN CALCIUM 10 MG PO TABS: ORAL_TABLET | ORAL | 3 refills | Status: DC

## 2020-10-01 NOTE — Progress Notes (Signed)
Disc space looks good. 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

## 2020-10-01 NOTE — Patient Instructions (Addendum)
Shoulder Impingement Syndrome Rehab Ask your health care provider which exercises are safe for you. Do exercises exactly as told by your health care provider and adjust them as directed. It is normal to feel mild stretching, pulling, tightness, or discomfort as you do these exercises. Stop right away if you feel sudden pain or your pain gets worse. Do not begin these exercises until told by your health care provider. Stretching and range-of-motion exercise This exercise warms up your muscles and joints and improves the movement and flexibility of your shoulder. This exercise also helps to relieve pain and stiffness. Passive horizontal adduction In passive adduction, you use your other hand to move the injured arm toward your body. The injured arm does not move on its own. In this movement, your arm is moved across your body in the horizontal plane (horizontal adduction). 1. Sit or stand and pull your left / right elbow across your chest, toward your other shoulder. Stop when you feel a gentle stretch in the back of your shoulder and upper arm. ? Keep your arm at shoulder height. ? Keep your arm as close to your body as you comfortably can. 2. Hold for __________ seconds. 3. Slowly return to the starting position. Repeat __________ times. Complete this exercise __________ times a day.   Strengthening exercises These exercises build strength and endurance in your shoulder. Endurance is the ability to use your muscles for a long time, even after they get tired. External rotation, isometric This is an exercise in which you press the back of your wrist against a door frame without moving your shoulder joint (isometric). 1. Stand or sit in a doorway, facing the door frame. 2. Bend your left / right elbow and place the back of your wrist against the door frame. Only the back of your wrist should be touching the frame. Keep your upper arm at your side. 3. Gently press your wrist against the door frame, as  if you are trying to push your arm away from your abdomen (external rotation). Press as hard as you are able without pain. ? Avoid shrugging your shoulder while you press your wrist against the door frame. Keep your shoulder blade tucked down toward the middle of your back. 4. Hold for __________ seconds. 5. Slowly release the tension, and relax your muscles completely before you repeat the exercise. Repeat __________ times. Complete this exercise __________ times a day. Internal rotation, isometric This is an exercise in which you press your palm against a door frame without moving your shoulder joint (isometric). 1. Stand or sit in a doorway, facing the door frame. 2. Bend your left / right elbow and place the palm of your hand against the door frame. Only your palm should be touching the frame. Keep your upper arm at your side. 3. Gently press your hand against the door frame, as if you are trying to push your arm toward your abdomen (internal rotation). Press as hard as you are able without pain. ? Avoid shrugging your shoulder while you press your hand against the door frame. Keep your shoulder blade tucked down toward the middle of your back. 4. Hold for __________ seconds. 5. Slowly release the tension, and relax your muscles completely before you repeat the exercise. Repeat __________ times. Complete this exercise __________ times a day.   Scapular protraction, supine 1. Lie on your back on a firm surface (supine position). Hold a __________ weight in your left / right hand. 2. Raise your left /  right arm straight into the air so your hand is directly above your shoulder joint. 3. Push the weight into the air so your shoulder (scapula) lifts off the surface that you are lying on. The scapula will push up or forward (protraction). Do not move your head, neck, or back. 4. Hold for __________ seconds. 5. Slowly return to the starting position. Let your muscles relax completely before you  repeat this exercise. Repeat __________ times. Complete this exercise __________ times a day.   Scapular retraction 1. Sit in a stable chair without armrests, or stand up. 2. Secure an exercise band to a stable object in front of you so the band is at shoulder height. 3. Hold one end of the exercise band in each hand. Your palms should face down. 4. Squeeze your shoulder blades together (retraction) and move your elbows slightly behind you. Do not shrug your shoulders upward while you do this. 5. Hold for __________ seconds. 6. Slowly return to the starting position. Repeat __________ times. Complete this exercise __________ times a day.   Shoulder extension 1. Sit in a stable chair without armrests, or stand up. 2. Secure an exercise band to a stable object in front of you so the band is above shoulder height. 3. Hold one end of the exercise band in each hand. 4. Straighten your elbows and lift your hands up to shoulder height. 5. Squeeze your shoulder blades together and pull your hands down to the sides of your thighs (extension). Stop when your hands are straight down by your sides. Do not let your hands go behind your body. 6. Hold for __________ seconds. 7. Slowly return to the starting position. Repeat __________ times. Complete this exercise __________ times a day.   This information is not intended to replace advice given to you by your health care provider. Make sure you discuss any questions you have with your health care provider. Document Revised: 11/26/2018 Document Reviewed: 08/30/2018 Elsevier Patient Education  2021 [employer] Low Back Sprain or Strain Rehab Ask your health care provider which exercises are safe for you. Do exercises exactly as told by your health care provider and adjust them as directed. It is normal to feel mild stretching, pulling, tightness, or discomfort as you do these exercises. Stop right away if you feel sudden pain or your pain gets worse. Do  not begin these exercises until told by your health care provider. Stretching and range-of-motion exercises These exercises warm up your muscles and joints and improve the movement and flexibility of your back. These exercises also help to relieve pain, numbness, and tingling. Lumbar rotation 1. Lie on your back on a firm surface and bend your knees. 2. Straighten your arms out to your sides so each arm forms a 90-degree angle (right angle) with a side of your body. 3. Slowly move (rotate) both of your knees to one side of your body until you feel a stretch in your lower back (lumbar). Try not to let your shoulders lift off the floor. 4. Hold this position for __________ seconds. 5. Tense your abdominal muscles and slowly move your knees back to the starting position. 6. Repeat this exercise on the other side of your body. Repeat __________ times. Complete this exercise __________ times a day.   Single knee to chest 1. Lie on your back on a firm surface with both legs straight. 2. Bend one of your knees. Use your hands to move your knee up toward your chest until you  feel a gentle stretch in your lower back and buttock. ? Hold your leg in this position by holding on to the front of your knee. ? Keep your other leg as straight as possible. 3. Hold this position for __________ seconds. 4. Slowly return to the starting position. 5. Repeat with your other leg. Repeat __________ times. Complete this exercise __________ times a day.   Prone extension on elbows 1. Lie on your abdomen on a firm surface (prone position). 2. Prop yourself up on your elbows. 3. Use your arms to help lift your chest up until you feel a gentle stretch in your abdomen and your lower back. ? This will place some of your body weight on your elbows. If this is uncomfortable, try stacking pillows under your chest. ? Your hips should stay down, against the surface that you are lying on. Keep your hip and back muscles  relaxed. 4. Hold this position for __________ seconds. 5. Slowly relax your upper body and return to the starting position. Repeat __________ times. Complete this exercise __________ times a day.   Strengthening exercises These exercises build strength and endurance in your back. Endurance is the ability to use your muscles for a long time, even after they get tired. Pelvic tilt This exercise strengthens the muscles that lie deep in the abdomen. 1. Lie on your back on a firm surface. Bend your knees and keep your feet flat on the floor. 2. Tense your abdominal muscles. Tip your pelvis up toward the ceiling and flatten your lower back into the floor. ? To help with this exercise, you may place a small towel under your lower back and try to push your back into the towel. 3. Hold this position for __________ seconds. 4. Let your muscles relax completely before you repeat this exercise. Repeat __________ times. Complete this exercise __________ times a day. Alternating arm and leg raises 1. Get on your hands and knees on a firm surface. If you are on a hard floor, you may want to use padding, such as an exercise mat, to cushion your knees. 2. Line up your arms and legs. Your hands should be directly below your shoulders, and your knees should be directly below your hips. 3. Lift your left leg behind you. At the same time, raise your right arm and straighten it in front of you. ? Do not lift your leg higher than your hip. ? Do not lift your arm higher than your shoulder. ? Keep your abdominal and back muscles tight. ? Keep your hips facing the ground. ? Do not arch your back. ? Keep your balance carefully, and do not hold your breath. 4. Hold this position for __________ seconds. 5. Slowly return to the starting position. 6. Repeat with your right leg and your left arm. Repeat __________ times. Complete this exercise __________ times a day.   Abdominal set with straight leg raise 1. Lie on  your back on a firm surface. 2. Bend one of your knees and keep your other leg straight. 3. Tense your abdominal muscles and lift your straight leg up, 4-6 inches (10-15 cm) off the ground. 4. Keep your abdominal muscles tight and hold this position for __________ seconds. ? Do not hold your breath. ? Do not arch your back. Keep it flat against the ground. 5. Keep your abdominal muscles tense as you slowly lower your leg back to the starting position. 6. Repeat with your other leg. Repeat __________ times. Complete this exercise __________  times a day.   Single leg lower with bent knees 1. Lie on your back on a firm surface. 2. Tense your abdominal muscles and lift your feet off the floor, one foot at a time, so your knees and hips are bent in 90-degree angles (right angles). ? Your knees should be over your hips and your lower legs should be parallel to the floor. 3. Keeping your abdominal muscles tense and your knee bent, slowly lower one of your legs so your toe touches the ground. 4. Lift your leg back up to return to the starting position. ? Do not hold your breath. ? Do not let your back arch. Keep your back flat against the ground. 5. Repeat with your other leg. Repeat __________ times. Complete this exercise __________ times a day. Posture and body mechanics Good posture and healthy body mechanics can help to relieve stress in your body's tissues and joints. Body mechanics refers to the movements and positions of your body while you do your daily activities. Posture is part of body mechanics. Good posture means:  Your spine is in its natural S-curve position (neutral).  Your shoulders are pulled back slightly.  Your head is not tipped forward. Follow these guidelines to improve your posture and body mechanics in your everyday activities. Standing  When standing, keep your spine neutral and your feet about hip width apart. Keep a slight bend in your knees. Your ears, shoulders,  and hips should line up.  When you do a task in which you stand in one place for a long time, place one foot up on a stable object that is 2-4 inches (5-10 cm) high, such as a footstool. This helps keep your spine neutral.   Sitting  When sitting, keep your spine neutral and keep your feet flat on the floor. Use a footrest, if necessary, and keep your thighs parallel to the floor. Avoid rounding your shoulders, and avoid tilting your head forward.  When working at a desk or a computer, keep your desk at a height where your hands are slightly lower than your elbows. Slide your chair under your desk so you are close enough to maintain good posture.  When working at a computer, place your monitor at a height where you are looking straight ahead and you do not have to tilt your head forward or downward to look at the screen.   Resting  When lying down and resting, avoid positions that are most painful for you.  If you have pain with activities such as sitting, bending, stooping, or squatting, lie in a position in which your body does not bend very much. For example, avoid curling up on your side with your arms and knees near your chest (fetal position).  If you have pain with activities such as standing for a long time or reaching with your arms, lie with your spine in a neutral position and bend your knees slightly. Try the following positions: ? Lying on your side with a pillow between your knees. ? Lying on your back with a pillow under your knees. Lifting  When lifting objects, keep your feet at least shoulder width apart and tighten your abdominal muscles.  Bend your knees and hips and keep your spine neutral. It is important to lift using the strength of your legs, not your back. Do not lock your knees straight out.  Always ask for help to lift heavy or awkward objects.   This information is not intended to replace  advice given to you by your health care provider. Make sure you discuss  any questions you have with your health care provider. Document Revised: 11/26/2018 Document Reviewed: 08/26/2018 Elsevier Patient Education  2021 [employer]

## 2020-10-01 NOTE — Progress Notes (Signed)
Subjective:    Patient ID: Kaitlin Bowman, female    DOB: 06-30-1971, 50 y.o.   MRN: [id number]  HPI  Pt is a 50 yo female with graves disease, T2DM, anemia, HLD, and vitamin D def who presents to the clinic for follow up and medication refill. Labs were just done.   .. Active Ambulatory Problems    Diagnosis Date Noted  . Type 2 diabetes mellitus without complication, without long-term current use of insulin (HCC) 12/01/2017  . 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 Anemia, iron deficiency 12/11/2017  . 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 Vitamin D deficiency 12/11/2017  . Graves disease 12/11/2017  . Vertigo 12/11/2017  . Irregular periods 01/26/2018  . Anemia 01/11/2019  . Microalbuminuria 01/14/2019  . Hyperlipidemia LDL goal <70 01/14/2019  . Palpitation 03/12/2020  . Lumbar facet arthropathy 10/01/2020  . Calcification of tendon 10/02/2020   Resolved Ambulatory Problems    Diagnosis Date Noted  . Well woman exam without gynecological exam 01/26/2018  . Essential hypertension 01/14/2019   Past Medical History:  Diagnosis Date  . Diabetes mellitus without complication (HCC)   . Menorrhagia   . Thyroid disease      Review of Systems See HPI.     Objective:   Physical Exam Vitals reviewed.  Constitutional:      Appearance: Normal appearance.  HENT:     Head: Normocephalic.  Cardiovascular:     Rate and Rhythm: Normal rate and regular rhythm.     Pulses: Normal pulses.  Pulmonary:     Effort: Pulmonary effort is normal.     Breath sounds: Normal breath sounds.  Musculoskeletal:     Right lower leg: No edema.     Left lower leg: No edema.     Comments: No lumbar spinal tenderness. Negative SLR, bilaterally.  NROM of bilateral lower ext. 5/5 strength bilateral lower ext Tenderness to palpation over left paraspinal muscles and SI area.   Left shoulder:  No bruising/swelling/redness/warmth No pinpoint tenderness to palpation.  Pain with abduction past 110  degrees. Strength 5/5.  Pain with hawkins and external and internal ROM.  Negative drop arm sign.  Negative Yergason.   Lymphadenopathy:     Cervical: No cervical adenopathy.  Neurological:     General: No focal deficit present.     Mental Status: She is alert.  Psychiatric:        Mood and Affect: Mood normal.       .. Depression screen [facility] 2/9 10/01/2020 03/09/2020 01/14/2019 04/28/2018 12/01/2017  Decreased Interest 1 1 1 2  0  Down, Depressed, Hopeless 1 1 1 2  0  PHQ - 2 Score 2 2 2 4  0  Altered sleeping 1 1 1 2  -  Tired, decreased energy 1 1 1 3  -  Change in appetite 1 0 0 3 -  Feeling bad or failure about yourself  1 1 1 1  -  Trouble concentrating 1 0 0 0 -  Moving slowly or fidgety/restless 0 0 0 0 -  Suicidal thoughts 0 0 0 0 -  PHQ-9 Score 7 5 5 13  -  Difficult doing work/chores Not difficult at all Not difficult at all Not difficult at all Somewhat difficult -   .. GAD 7 : Generalized Anxiety Score 10/01/2020 03/09/2020 01/14/2019 04/28/2018  Nervous, Anxious, on Edge 1 1 0 1  Control/stop worrying 1 1 0 1  Worry too much - different things 0 1 1 1   Trouble relaxing 0 0 0 1  Restless 0 0 0 0  Easily annoyed or irritable 1 1 1 1   Afraid - awful might happen 0 0 1 1  Total GAD 7 Score 3 4 3 6   Anxiety Difficulty Not difficult at all Not difficult at all Not difficult at all Somewhat difficult       . Lab Results  Component Value Date   HGBA1C 6.6 (H) 09/18/2020    Assessment & Plan:  Marland Kitchen04/01/2022Marland Kitchen was seen today for follow-up.  Diagnoses and all orders for this visit:  Type 2 diabetes mellitus without complication, without long-term current use of insulin (HCC)  Colon cancer screening -     Ambulatory referral to Gastroenterology  Hyperlipidemia LDL goal <70 -     rosuvastatin (CRESTOR) 10 MG tablet; Take once a week.  Chronic left shoulder pain -     DG Shoulder Left -     diclofenac Sodium (VOLTAREN) 1 % GEL; Apply 4 g topically 4 (four) times daily. To  affected joint.  Acute right-sided low back pain without sciatica -     diclofenac Sodium (VOLTAREN) 1 % GEL; Apply 4 g topically 4 (four) times daily. To affected joint. -     DG Lumbar Spine Complete   Reviewed labs with patient.  A1C to goal.  Continue metformin.  BP not quite to goal. No on ace.  Not able to tolerate daily statin. LDL 93 not to goal under 70. Try crestor once a week.  Foot and eye exam UTD.  Covid/pneumonia/flu UTD.  Follow up in 6 months.   Needs colonoscopy. Ordered today.   Will get xray of low back and left shoulder.  Likely more degenerative changes.  Continue to use topical NSAID. Consider oral nsAID.  Discussed exercises and stretches.  Consider tens until and heat/ice.  Follow up as needed or if symptoms persist.    Send all labs through [facility].

## 2020-10-02 ENCOUNTER — Encounter: Payer: Self-pay | Admitting: Physician Assistant

## 2020-10-02 DIAGNOSIS — M6580 Other synovitis and tenosynovitis, unspecified site: Secondary | ICD-10-CM | POA: Insufficient documentation

## 2020-10-02 NOTE — Progress Notes (Signed)
There is a tiny focus of calcification in the lateral aspect of the joint that looks like calcific tendinosis. No significant arthritis. I think formal physical therapy could help more than the home exercises. NSAIds as needed for pain. Heat and ice both could help. Consider follow up with Dr. Karie Schwalbe our sports medicine provider. He could consider injection in shoulder to help recover faster.

## 2020-11-17 ENCOUNTER — Other Ambulatory Visit (HOSPITAL_COMMUNITY): Payer: Self-pay

## 2020-11-20 ENCOUNTER — Other Ambulatory Visit (HOSPITAL_COMMUNITY): Payer: Self-pay

## 2020-11-20 MED FILL — Metformin HCl Tab ER 24HR 500 MG: ORAL | 90 days supply | Qty: 180 | Fill #0 | Status: AC

## 2020-11-28 ENCOUNTER — Other Ambulatory Visit (HOSPITAL_COMMUNITY): Payer: Self-pay

## 2021-01-16 ENCOUNTER — Other Ambulatory Visit: Payer: Self-pay | Admitting: Physician Assistant

## 2021-01-16 DIAGNOSIS — Z1231 Encounter for screening mammogram for malignant neoplasm of breast: Secondary | ICD-10-CM

## 2021-02-12 ENCOUNTER — Other Ambulatory Visit (HOSPITAL_COMMUNITY): Payer: Self-pay

## 2021-02-22 ENCOUNTER — Other Ambulatory Visit (HOSPITAL_COMMUNITY): Payer: Self-pay

## 2021-02-22 MED FILL — Metformin HCl Tab ER 24HR 500 MG: ORAL | 90 days supply | Qty: 180 | Fill #1 | Status: AC

## 2021-03-05 ENCOUNTER — Other Ambulatory Visit (HOSPITAL_COMMUNITY): Payer: Self-pay

## 2021-03-13 ENCOUNTER — Ambulatory Visit
Admission: RE | Admit: 2021-03-13 | Discharge: 2021-03-13 | Disposition: A | Discharge status: Home / Self Care | Payer: 59 | Source: Ambulatory Visit | Attending: Physician Assistant | Admitting: Physician Assistant

## 2021-03-13 ENCOUNTER — Other Ambulatory Visit: Payer: Self-pay

## 2021-03-13 DIAGNOSIS — Z1231 Encounter for screening mammogram for malignant neoplasm of breast: Secondary | ICD-10-CM | POA: Diagnosis not present

## 2021-03-15 NOTE — Progress Notes (Signed)
Normal mammogram. Follow up in 1 year.

## 2021-04-01 ENCOUNTER — Encounter: Payer: 59 | Admitting: Physician Assistant

## 2021-04-03 ENCOUNTER — Telehealth: Payer: Self-pay | Admitting: Neurology

## 2021-04-03 DIAGNOSIS — E119 Type 2 diabetes mellitus without complications: Secondary | ICD-10-CM

## 2021-04-03 DIAGNOSIS — Z0000 Encounter for general adult medical examination without abnormal findings: Secondary | ICD-10-CM

## 2021-04-03 DIAGNOSIS — I10 Essential (primary) hypertension: Secondary | ICD-10-CM

## 2021-04-03 DIAGNOSIS — Z1329 Encounter for screening for other suspected endocrine disorder: Secondary | ICD-10-CM

## 2021-04-03 NOTE — Telephone Encounter (Signed)
Patient called wanting [place] to mail her lab orders to her to have done. It does look like she had a lipid screen in February, pended some labs. Please review and advise. [phone]

## 2021-04-05 ENCOUNTER — Encounter: Payer: 59 | Admitting: Physician Assistant

## 2021-04-05 NOTE — Telephone Encounter (Signed)
Patient made aware. Labs mailed to patient.

## 2021-04-15 NOTE — Telephone Encounter (Signed)
Patient called back to see if Kaitlin Bowman wanted to check lipids. Made her aware we checked these 6 months ago and only needs once a year.

## 2021-04-16 DIAGNOSIS — Z0000 Encounter for general adult medical examination without abnormal findings: Secondary | ICD-10-CM | POA: Diagnosis not present

## 2021-04-16 DIAGNOSIS — I10 Essential (primary) hypertension: Secondary | ICD-10-CM | POA: Diagnosis not present

## 2021-04-16 DIAGNOSIS — E119 Type 2 diabetes mellitus without complications: Secondary | ICD-10-CM | POA: Diagnosis not present

## 2021-04-16 DIAGNOSIS — Z1329 Encounter for screening for other suspected endocrine disorder: Secondary | ICD-10-CM | POA: Diagnosis not present

## 2021-04-17 NOTE — Progress Notes (Signed)
Appt 9/1 to discuss:   A1C stable at 6.6.  Your are anemic. Are you taking any iron? JJ, please add ferritin and serum iron.  Kidney and liver look good.

## 2021-04-18 ENCOUNTER — Other Ambulatory Visit (HOSPITAL_COMMUNITY): Payer: Self-pay

## 2021-04-18 ENCOUNTER — Other Ambulatory Visit: Payer: Self-pay

## 2021-04-18 ENCOUNTER — Ambulatory Visit (INDEPENDENT_AMBULATORY_CARE_PROVIDER_SITE_OTHER): Payer: 59 | Admitting: Physician Assistant

## 2021-04-18 ENCOUNTER — Encounter: Payer: Self-pay | Admitting: Physician Assistant

## 2021-04-18 VITALS — BP 127/90 | HR 92 | Ht 63.0 in | Wt 138.0 lb

## 2021-04-18 DIAGNOSIS — E119 Type 2 diabetes mellitus without complications: Secondary | ICD-10-CM

## 2021-04-18 DIAGNOSIS — E785 Hyperlipidemia, unspecified: Secondary | ICD-10-CM | POA: Diagnosis not present

## 2021-04-18 DIAGNOSIS — R002 Palpitations: Secondary | ICD-10-CM

## 2021-04-18 DIAGNOSIS — Z0000 Encounter for general adult medical examination without abnormal findings: Secondary | ICD-10-CM | POA: Diagnosis not present

## 2021-04-18 DIAGNOSIS — D508 Other iron deficiency anemias: Secondary | ICD-10-CM

## 2021-04-18 DIAGNOSIS — Z1211 Encounter for screening for malignant neoplasm of colon: Secondary | ICD-10-CM | POA: Diagnosis not present

## 2021-04-18 LAB — CBC WITH DIFFERENTIAL/PLATELET
Absolute Monocytes: 352 cells/uL (ref 200–950)
Basophils Absolute: 112 cells/uL (ref 0–200)
Basophils Relative: 1.4 %
Eosinophils Absolute: 72 cells/uL (ref 15–500)
Eosinophils Relative: 0.9 %
HCT: 35.4 % (ref 35.0–45.0)
Hemoglobin: 10.6 g/dL — ABNORMAL LOW (ref 11.7–15.5)
Lymphs Abs: 1544 cells/uL (ref 850–3900)
MCH: 23.7 pg — ABNORMAL LOW (ref 27.0–33.0)
MCHC: 29.9 g/dL — ABNORMAL LOW (ref 32.0–36.0)
MCV: 79.2 fL — ABNORMAL LOW (ref 80.0–100.0)
MPV: 9.7 fL (ref 7.5–12.5)
Monocytes Relative: 4.4 %
Neutro Abs: 5920 cells/uL (ref 1500–7800)
Neutrophils Relative %: 74 %
Platelets: 513 10*3/uL — ABNORMAL HIGH (ref 140–400)
RBC: 4.47 10*6/uL (ref 3.80–5.10)
RDW: 15.7 % — ABNORMAL HIGH (ref 11.0–15.0)
Total Lymphocyte: 19.3 %
WBC: 8 10*3/uL (ref 3.8–10.8)

## 2021-04-18 LAB — HEMOGLOBIN A1C
Hgb A1c MFr Bld: 6.6 % of total Hgb — ABNORMAL HIGH (ref <5.7)
Mean Plasma Glucose: 143 mg/dL
eAG (mmol/L): 7.9 mmol/L

## 2021-04-18 LAB — COMPLETE METABOLIC PANEL WITH GFR
AG Ratio: 1.4 (calc) (ref 1.0–2.5)
ALT: 11 U/L (ref 6–29)
AST: 17 U/L (ref 10–35)
Albumin: 4.2 g/dL (ref 3.6–5.1)
Alkaline phosphatase (APISO): 50 U/L (ref 37–153)
BUN: 12 mg/dL (ref 7–25)
CO2: 26 mmol/L (ref 20–32)
Calcium: 9.3 mg/dL (ref 8.6–10.4)
Chloride: 102 mmol/L (ref 98–110)
Creat: 0.55 mg/dL (ref 0.50–1.03)
Globulin: 3.1 g/dL (calc) (ref 1.9–3.7)
Glucose, Bld: 164 mg/dL — ABNORMAL HIGH (ref 65–139)
Potassium: 4 mmol/L (ref 3.5–5.3)
Sodium: 137 mmol/L (ref 135–146)
Total Bilirubin: 0.4 mg/dL (ref 0.2–1.2)
Total Protein: 7.3 g/dL (ref 6.1–8.1)
eGFR: 111 mL/min/{1.73_m2} (ref >=60)

## 2021-04-18 LAB — POCT UA - MICROALBUMIN
Albumin/Creatinine Ratio, Urine, POC: <30
Creatinine, POC: 300 mg/dL
Microalbumin Ur, POC: 30 mg/L

## 2021-04-18 LAB — TSH: TSH: 2.61 mIU/L

## 2021-04-18 LAB — IRON,TIBC AND FERRITIN PANEL
%SAT: 7 % (calc) — ABNORMAL LOW (ref 16–45)
Ferritin: 3 ng/mL — ABNORMAL LOW (ref 16–232)
Iron: 27 ug/dL — ABNORMAL LOW (ref 45–160)
TIBC: 401 mcg/dL (calc) (ref 250–450)

## 2021-04-18 MED ORDER — METFORMIN HCL ER 500 MG PO TB24
500.0000 mg | ORAL_TABLET | Freq: Two times a day (BID) | ORAL | 3 refills | Status: DC
  Filled 2021-04-18: qty 180, fill #0
  Filled 2021-05-20: qty 180, 90d supply, fill #0
  Filled 2021-08-12: qty 180, 90d supply, fill #1

## 2021-04-18 MED ORDER — ROSUVASTATIN CALCIUM 10 MG PO TABS
10.0000 mg | ORAL_TABLET | ORAL | 3 refills | Status: DC
  Filled 2021-04-18: qty 12, 84d supply, fill #0
  Filled 2021-08-19: qty 12, 84d supply, fill #1
  Filled 2021-11-25: qty 12, 84d supply, fill #2
  Filled 2022-02-25 – 2022-03-05 (×2): qty 12, 84d supply, fill #3

## 2021-04-18 NOTE — Patient Instructions (Signed)
Palpitations Palpitations are feelings that your heartbeat is irregular or is faster than normal. It may feel like your heart is fluttering or skipping a beat. Palpitations are usually not a serious problem. They may be caused by many things, including smoking, caffeine, alcohol, stress, and certain medicines or drugs. Most causes of palpitations are not serious. However, some palpitations can be a sign of a serious problem. You may need further tests to rule out serious medical problems. Follow these instructions at home:   Pay attention to any changes in your condition. Take these actions to help manage your symptoms: Eating and drinking Avoid foods and drinks that may cause palpitations. These may include: Caffeinated coffee, tea, soft drinks, diet pills, and energy drinks. Chocolate. Alcohol. Lifestyle Take steps to reduce your stress and anxiety. Things that can help you relax include: Yoga. Mind-body activities, such as deep breathing, meditation, or using words and images to create positive thoughts (guided imagery). Physical activity, such as swimming, jogging, or walking. Tell your health care provider if your palpitations increase with activity. If you have chest pain or shortness of breath with activity, do not continue the activity until you are seen by your health care provider. Biofeedback. This is a method that helps you learn to use your mind to control things in your body, such as your heartbeat. Do not use drugs, including cocaine or ecstasy. Do not use marijuana. Get plenty of rest and sleep. Keep a regular bed time. General instructions Take over-the-counter and prescription medicines only as told by your health care provider. Do not use any products that contain nicotine or tobacco, such as cigarettes and e-cigarettes. If you need help quitting, ask your health care provider. Keep all follow-up visits as told by your health care provider. This is important. These may include  visits for further testing if palpitations do not go away or get worse. Contact a health care provider if you: Continue to have a fast or irregular heartbeat after 24 hours. Notice that your palpitations occur more often. Get help right away if you: Have chest pain or shortness of breath. Have a severe headache. Feel dizzy or you faint. Summary Palpitations are feelings that your heartbeat is irregular or is faster than normal. It may feel like your heart is fluttering or skipping a beat. Palpitations may be caused by many things, including smoking, caffeine, alcohol, stress, certain medicines, and drugs. Although most causes of palpitations are not serious, some causes can be a sign of a serious medical problem. Get help right away if you faint or have chest pain, shortness of breath, a severe headache, or dizziness. This information is not intended to replace advice given to you by your health care provider. Make sure you discuss any questions you have with your health care provider. Document Revised: 09/09/2017 Document Reviewed: 09/16/2017 Elsevier Patient Education  2022 [employer] Inc.  

## 2021-04-18 NOTE — Progress Notes (Addendum)
Subjective:    Patient ID: Kaitlin Bowman, female    DOB: [birth year], 50 y.o.   MRN: [id number]  HPI Pt is a 50 yo female with T2DM, IDA, HLD who presents to the clinic for routine follow up.   She is doing good. She is taking metformin but not checking her sugars. She tries to stay active and eat low sugar diet. She denies any open wounds or sores. She denies any hypoglycemia. Denies CP, headaches, dizziness. She has had 2 episodes of palpitations. She has felt more fatigued over last month.   .. 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   Vitamin D deficiency 12/11/2017   Graves disease 12/11/2017   Vertigo 12/11/2017   Irregular periods 01/26/2018   Anemia 01/11/2019   Microalbuminuria 01/14/2019   Hyperlipidemia LDL goal <70 01/14/2019   Palpitations 03/12/2020   Lumbar facet arthropathy 10/01/2020   Calcification of tendon 10/02/2020   Resolved Ambulatory Problems    Diagnosis Date Noted   Well woman exam without gynecological exam 01/26/2018   Essential hypertension 01/14/2019   Past Medical History:  Diagnosis Date   Diabetes mellitus without complication (HCC)    Menorrhagia    Thyroid disease        Review of Systems See HPI.     Objective:   Physical Exam Vitals reviewed.  Constitutional:      Appearance: Normal appearance.  HENT:     Head: Normocephalic.  Neck:     Vascular: No carotid bruit.  Cardiovascular:     Rate and Rhythm: Normal rate and regular rhythm.     Pulses: Normal pulses.     Heart sounds: Normal heart sounds.  Pulmonary:     Effort: Pulmonary effort is normal.     Breath sounds: Normal breath sounds.  Musculoskeletal:     Cervical back: Normal range of motion and neck supple.     Right lower leg: No edema.     Left  lower leg: No edema.  Lymphadenopathy:     Cervical: No cervical adenopathy.  Neurological:     General: No focal deficit present.     Mental Status: She is alert and oriented to person, place, and time.  Psychiatric:        Mood and Affect: Mood normal.  .. Results for orders placed or performed in visit on 04/18/21  POCT UA - Microalbumin  Result Value Ref Range   Microalbumin Ur, POC 30 mg/L   Creatinine, POC 300 mg/dL   Albumin/Creatinine Ratio, Urine, POC <30            Assessment & Plan:  Marland KitchenMarland KitchenWestley Bowman was seen today for follow-up.  Diagnoses and all orders for this visit:  Routine physical examination  Iron deficiency anemia secondary to inadequate dietary iron intake  Hyperlipidemia LDL goal <70 -     rosuvastatin (CRESTOR) 10 MG tablet; Take 1 tablet (10 mg total) by mouth once a week.  Type 2 diabetes mellitus without complication, without long-term current use of insulin (HCC) -     metFORMIN (GLUCOPHAGE-XR) 500 MG 24 hr tablet; Take 1 tablet (500 mg total) by mouth 2 (two) times daily. -     POCT UA - Microalbumin  Palpitations  Colon cancer screening -     Ambulatory  referral to Gastroenterology   Reviewed labs with patient.   A1C under 7.  Continue on metformin.  Not taking statin. Discussed risk. Agreed to try once a week.  BP to goal.  Microalbumin normal.  .. Diabetic Foot Exam - Simple   Simple Foot Form Diabetic Foot exam was performed with the following findings: Yes 04/18/2021 10:27 AM  Visual Inspection No deformities, no ulcerations, no other skin breakdown bilaterally: Yes Sensation Testing Intact to touch and monofilament testing bilaterally: Yes Pulse Check Posterior Tibialis and Dorsalis pulse intact bilaterally: Yes Comments    Encouraged flu shot.   Colonoscopy ordered.  Declined pneumonia vaccine, flu vaccine, shingles vaccine.   Discussed HgB at 10.6 and low iron and iron stores. She will restart ferrous sulfate. Increase  iron rich foods.  Denies any melena or hematochezia. Needs colonoscopy. Recheck in 3-6 months.   Discussed palpitations could be anemia/dehydration/stress. Watch for triggers.  HO given. Follow up if continue.

## 2021-04-18 NOTE — Progress Notes (Signed)
Iron is very low. Are you having any bright red or black tarry stools? Last colonoscopy? Abdominal pain?

## 2021-05-21 ENCOUNTER — Other Ambulatory Visit (HOSPITAL_COMMUNITY): Payer: Self-pay

## 2021-05-22 ENCOUNTER — Encounter: Payer: Self-pay | Admitting: Neurology

## 2021-08-06 ENCOUNTER — Encounter: Payer: Self-pay | Admitting: Physician Assistant

## 2021-08-13 ENCOUNTER — Other Ambulatory Visit (HOSPITAL_COMMUNITY): Payer: Self-pay

## 2021-08-20 ENCOUNTER — Other Ambulatory Visit (HOSPITAL_COMMUNITY): Payer: Self-pay

## 2021-08-29 DIAGNOSIS — E119 Type 2 diabetes mellitus without complications: Secondary | ICD-10-CM | POA: Diagnosis not present

## 2021-08-29 LAB — HM DIABETES EYE EXAM

## 2021-10-10 DIAGNOSIS — H0011 Chalazion right upper eyelid: Secondary | ICD-10-CM | POA: Diagnosis not present

## 2021-10-14 ENCOUNTER — Other Ambulatory Visit: Payer: Self-pay

## 2021-10-14 DIAGNOSIS — E119 Type 2 diabetes mellitus without complications: Secondary | ICD-10-CM

## 2021-10-16 ENCOUNTER — Ambulatory Visit: Payer: 59 | Admitting: Physician Assistant

## 2021-10-21 ENCOUNTER — Other Ambulatory Visit: Payer: Self-pay

## 2021-10-21 DIAGNOSIS — E119 Type 2 diabetes mellitus without complications: Secondary | ICD-10-CM

## 2021-10-21 NOTE — Progress Notes (Signed)
Ordered HgbA1C per patient request. She doesn't want to do a finger stick.  ?

## 2021-10-22 ENCOUNTER — Other Ambulatory Visit: Payer: Self-pay

## 2021-10-22 ENCOUNTER — Ambulatory Visit (INDEPENDENT_AMBULATORY_CARE_PROVIDER_SITE_OTHER): Payer: 59 | Admitting: Physician Assistant

## 2021-10-22 ENCOUNTER — Ambulatory Visit (INDEPENDENT_AMBULATORY_CARE_PROVIDER_SITE_OTHER): Payer: 59

## 2021-10-22 ENCOUNTER — Other Ambulatory Visit (HOSPITAL_COMMUNITY): Payer: Self-pay

## 2021-10-22 ENCOUNTER — Encounter: Payer: Self-pay | Admitting: Physician Assistant

## 2021-10-22 VITALS — BP 130/73 | HR 90 | Ht 63.0 in | Wt 141.0 lb

## 2021-10-22 DIAGNOSIS — E785 Hyperlipidemia, unspecified: Secondary | ICD-10-CM

## 2021-10-22 DIAGNOSIS — E119 Type 2 diabetes mellitus without complications: Secondary | ICD-10-CM | POA: Diagnosis not present

## 2021-10-22 DIAGNOSIS — M25562 Pain in left knee: Secondary | ICD-10-CM | POA: Diagnosis not present

## 2021-10-22 DIAGNOSIS — G8929 Other chronic pain: Secondary | ICD-10-CM

## 2021-10-22 MED ORDER — DICLOFENAC SODIUM 1 % EX GEL
4.0000 g | Freq: Four times a day (QID) | CUTANEOUS | 1 refills | Status: DC
  Filled 2021-10-22: qty 100, 6d supply, fill #0

## 2021-10-22 NOTE — Patient Instructions (Signed)
Exercises for Chronic Knee Pain °Chronic knee pain is pain that lasts longer than 3 months. For most people with chronic knee pain, exercise and weight loss is an important part of treatment. Your health care provider may want you to focus on: °Strengthening the muscles that support your knee. This can take pressure off your knee and lessen pain. °Preventing knee stiffness. °Maintaining or increasing how far you can move your knee. °Losing weight (if this applies) to take pressure off your knee, decrease your risk for injury, and make it easier for you to exercise. °Your health care provider will help you develop an exercise program that matches your needs and physical abilities. Below are simple, low-impact exercises you can do at home. Ask your health care provider or a physical therapist how often you should do your exercise program and how many times to repeat each exercise. °General safety tips °Follow these safety tips for exercising with chronic knee pain: °Get your health care provider's approval before doing any exercises. °Start slowly and stop any time an exercise causes pain. °Do not exercise if your knee pain is flaring up. °Warm up first. Stretching a cold muscle can cause an injury. Do 5-10 minutes of easy movement or light stretching before beginning your exercise routine. °Do 5-10 minutes of low-impact activity (like walking or cycling) before starting strengthening exercises. °Contact your health care provider any time you have pain during or after exercising. Exercise may cause discomfort but should not be painful. It is normal to be a little stiff or sore after exercising. ° °Stretching and range-of-motion exercises °Front thigh stretch ° °Stand up straight and support your body by holding on to a chair or resting one hand on a wall. °With your legs straight and close together, bend one knee to lift your heel up toward your buttocks. °Using one hand for support, grab your ankle with your free  hand. °Pull your foot up closer toward your buttocks to feel the stretch in front of your thigh. °Hold the stretch for 30 seconds. °Repeat __________ times. Complete this exercise __________ times a day. °Back thigh stretch ° °Sit on the floor with your back straight and your legs out straight in front of you. °Place the palms of your hands on the floor and slide them toward your feet as you bend at the hip. °Try to touch your nose to your knees and feel the stretch in the back of your thighs. °Hold for 30 seconds. °Repeat __________ times. Complete this exercise __________ times a day. °Calf stretch ° °Stand facing a wall. °Place the palms of your hands flat against the wall, arms extended, and lean slightly against the wall. °Get into a lunge position with one leg bent at the knee and the other leg stretched out straight behind you. °Keep both feet facing the wall and increase the bend in your knee while keeping the heel of the other leg flat on the ground. °You should feel the stretch in your calf. Hold for 30 seconds. °Repeat __________ times. Complete this exercise __________ times a day. °Strengthening exercises °Straight leg lift °Lie on your back with one knee bent and the other leg out straight. °Slowly lift the straight leg without bending the knee. °Lift until your foot is about 12 inches (30 cm) off the floor. °Hold for 3-5 seconds and slowly lower your leg. °Repeat __________ times. Complete this exercise __________ times a day. °Single leg dip °Stand between two chairs and put both hands on the   backs of the chairs for support. °Extend one leg out straight with your body weight resting on the heel of the standing leg. °Slowly bend your standing knee to dip your body to the level that is comfortable for you. °Hold for 3-5 seconds. °Repeat __________ times. Complete this exercise __________ times a day. °Hamstring curls °Stand straight, knees close together, facing the back of a chair. °Hold on to the  back of a chair with both hands. °Keep one leg straight. Bend the other knee while bringing the heel up toward the buttock until the knee is bent at a 90-degree angle (right angle). °Hold for 3-5 seconds. °Repeat __________ times. Complete this exercise __________ times a day. °Wall squat °Stand straight with your back, hips, and head against a wall. °Step forward one foot at a time with your back still against the wall. °Your feet should be 2 feet (61 cm) from the wall at shoulder width. Keeping your back, hips, and head against the wall, slide down the wall to as close of a sitting position as you can get. °Hold for 5-10 seconds, then slowly slide back up. °Repeat __________ times. Complete this exercise __________ times a day. °Step-ups °Step up with one foot onto a sturdy platform or stool that is about 6 inches (15 cm) high. °Face sideways with one foot on the platform and one on the ground. °Place all your weight on the platform foot and lift your body off the ground until your knee extends. °Let your other leg hang free to the side. °Hold for 3-5 seconds then slowly lower your weight down to the floor foot. °Repeat __________ times. Complete this exercise __________ times a day. °Contact a health care provider if: °Your exercise causes pain. °Your pain is worse after you exercise. °Your pain prevents you from doing your exercises. °This information is not intended to replace advice given to you by your health care provider. Make sure you discuss any questions you have with your health care provider. °Document Revised: 12/08/2019 Document Reviewed: 08/01/2019 °Elsevier Patient Education © 2022 [employer] ° °

## 2021-10-22 NOTE — Progress Notes (Signed)
? ?Subjective:  ? ? Patient ID: Kaitlin Bowman, female    DOB: 1971-05-22, 51 y.o.   MRN: [id number] ? ?HPI ?Pt is a 51 yo female who has T2DM, Graves disease, anemia, HDL, vitamin D def who presents to the clinic for medication refills.  ? ?Pt is doing ok. She is not checking her sugars. She is taking metformin only. She denies any CP, palpitations, headache. She denies any open sores or wounds. She is trying to manage sugars with diet and exercise.  ? ?She is having some left medial knee pain for over 6 months. No injury. Dull ache and waxes and wanes. Not trying anything to make better.  ? ?.. ?Active Ambulatory Problems  ?  Diagnosis Date Noted  ? Type 2 diabetes mellitus without complication, without long-term current use of insulin (HCC) 12/01/2017  ? Goiter 12/11/2017  ? Anemia, iron deficiency 12/11/2017  ? 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 Vitamin D deficiency 12/11/2017  ? Graves disease 12/11/2017  ? Vertigo 12/11/2017  ? Irregular periods 01/26/2018  ? Anemia 01/11/2019  ? Microalbuminuria 01/14/2019  ? Hyperlipidemia LDL goal <70 01/14/2019  ? Palpitations 03/12/2020  ? Lumbar facet arthropathy 10/01/2020  ? Calcification of tendon 10/02/2020  ? ?Resolved Ambulatory Problems  ?  Diagnosis Date Noted  ? Well woman exam without gynecological exam 01/26/2018  ? Essential hypertension 01/14/2019  ? ?Past Medical History:  ?Diagnosis Date  ? Diabetes mellitus without complication (HCC)   ? Menorrhagia   ? Thyroid disease   ?' ? ? ?Review of Systems ?See HPI.  ?   ?Objective:  ? Physical Exam ?Vitals reviewed.  ?Constitutional:   ?   Appearance: Normal appearance.  ?HENT:  ?   Head: Normocephalic.  ?Neck:  ?   Vascular: No carotid bruit.  ?Cardiovascular:  ?   Rate and Rhythm: Normal rate and regular rhythm.  ?   Pulses: Normal pulses.  ?   Heart sounds: Normal heart sounds.  ?Pulmonary:  ?   Effort: Pulmonary effort is normal.  ?   Breath sounds: Normal breath sounds.  ?Abdominal:  ?   General: There is  no distension.  ?   Palpations: There is no mass.  ?   Tenderness: There is no right CVA tenderness or left CVA tenderness.  ?Musculoskeletal:     ?   General: No swelling.  ?   Cervical back: Normal range of motion and neck supple.  ?   Right lower leg: No edema.  ?   Left lower leg: No edema.  ?Neurological:  ?   General: No focal deficit present.  ?   Mental Status: She is alert and oriented to person, place, and time.  ?Psychiatric:     ?   Mood and Affect: Mood normal.  ? ?Left knee: ?NROM ?No swelling ?Not warm to touch ?Strength 5/5.  ?Negative anterior drawer or mcmurrays ?No laxity ?Tenderness just above the medial knee joint to palpation. ? ? ? ?   ?Assessment & Plan:  ?..Kaitlin Bowman was seen today for follow-up. ? ?Diagnoses and all orders for this visit: ? ?Type 2 diabetes mellitus without complication, without long-term current use of insulin (HCC) ? ?Hyperlipidemia LDL goal <70 ? ?Chronic pain of left knee ?-     DG Knee 4 Views W/Patella Left; Future ?-     diclofenac Sodium (VOLTAREN) 1 % GEL; Apply 4 g topically to affected joint four times a day. ? ? ?Will get labs and  A1C in with labs.  ?Continue metformin.  ?BP to goal.  ?On statin.  ?Eye exam UTD. ?Foot exam UTD. ?Covid vaccine x2. ?Flu shot UTD.  ?Declined pneumonia vaccine.  ?Declined shingrix. ?Follow up in 3 months.  ? ?Will get xrays for left knee.  ?Given exercises to start for strengthening.  ?Ok to use voltaren gel. ?Follow up with Dr. Karie Schwalbe in 1 month.  ? ? ?

## 2021-10-23 ENCOUNTER — Other Ambulatory Visit (HOSPITAL_COMMUNITY): Payer: Self-pay

## 2021-10-23 ENCOUNTER — Other Ambulatory Visit: Payer: Self-pay | Admitting: Physician Assistant

## 2021-10-23 DIAGNOSIS — E785 Hyperlipidemia, unspecified: Secondary | ICD-10-CM

## 2021-10-23 DIAGNOSIS — E119 Type 2 diabetes mellitus without complications: Secondary | ICD-10-CM

## 2021-10-23 LAB — HEMOGLOBIN A1C
Hgb A1c MFr Bld: 6.9 % of total Hgb — ABNORMAL HIGH (ref <5.7)
Mean Plasma Glucose: 151 mg/dL
eAG (mmol/L): 8.4 mmol/L

## 2021-10-23 MED ORDER — METFORMIN HCL ER 500 MG PO TB24
500.0000 mg | ORAL_TABLET | Freq: Two times a day (BID) | ORAL | 3 refills | Status: DC
  Filled 2021-10-23 – 2021-11-25 (×2): qty 180, 90d supply, fill #0
  Filled 2022-02-25 – 2022-03-05 (×2): qty 180, 90d supply, fill #1

## 2021-10-23 NOTE — Progress Notes (Signed)
No significant arthritic findings therefore pain is more musculoskeletal and/or of the soft tissue

## 2021-10-23 NOTE — Progress Notes (Signed)
A1C is 6.9 and still under 7.0 so we don't have to make any medication changes. We could be more aggressive in our approach if you desired. We could add SGLT that can make you urinate off a little sugar each day or GLP-1 that can help with sugars and weight loss.

## 2021-10-28 ENCOUNTER — Other Ambulatory Visit (HOSPITAL_COMMUNITY): Payer: Self-pay

## 2021-11-05 ENCOUNTER — Other Ambulatory Visit (HOSPITAL_COMMUNITY): Payer: Self-pay

## 2021-11-26 ENCOUNTER — Other Ambulatory Visit (HOSPITAL_COMMUNITY): Payer: Self-pay

## 2021-12-23 ENCOUNTER — Encounter: Payer: Self-pay | Admitting: Physician Assistant

## 2021-12-24 ENCOUNTER — Other Ambulatory Visit (HOSPITAL_COMMUNITY): Payer: Self-pay

## 2021-12-24 MED ORDER — MECLIZINE HCL 25 MG PO TABS
25.0000 mg | ORAL_TABLET | Freq: Three times a day (TID) | ORAL | 1 refills | Status: DC | PRN
  Filled 2021-12-30: qty 30, 10d supply, fill #0

## 2021-12-24 MED ORDER — MECLIZINE HCL 25 MG PO TABS
25.0000 mg | ORAL_TABLET | Freq: Three times a day (TID) | ORAL | 1 refills | Status: DC | PRN
  Filled 2021-12-24: qty 30, 10d supply, fill #0

## 2021-12-27 ENCOUNTER — Other Ambulatory Visit (HOSPITAL_COMMUNITY): Payer: Self-pay

## 2021-12-30 ENCOUNTER — Other Ambulatory Visit (HOSPITAL_COMMUNITY): Payer: Self-pay

## 2021-12-31 ENCOUNTER — Other Ambulatory Visit: Payer: Self-pay | Admitting: Physician Assistant

## 2021-12-31 ENCOUNTER — Other Ambulatory Visit (HOSPITAL_COMMUNITY): Payer: Self-pay

## 2021-12-31 DIAGNOSIS — Z1231 Encounter for screening mammogram for malignant neoplasm of breast: Secondary | ICD-10-CM

## 2022-01-03 ENCOUNTER — Encounter (HOSPITAL_COMMUNITY): Payer: Self-pay

## 2022-01-03 ENCOUNTER — Other Ambulatory Visit (HOSPITAL_COMMUNITY): Payer: Self-pay

## 2022-01-03 ENCOUNTER — Ambulatory Visit (HOSPITAL_COMMUNITY)
Admission: RE | Admit: 2022-01-03 | Discharge: 2022-01-03 | Disposition: A | Discharge status: Home / Self Care | Payer: 59 | Source: Ambulatory Visit | Attending: Student | Admitting: Student

## 2022-01-03 VITALS — BP 137/90 | HR 104 | Temp 98.8°F | Resp 18

## 2022-01-03 DIAGNOSIS — J40 Bronchitis, not specified as acute or chronic: Secondary | ICD-10-CM

## 2022-01-03 DIAGNOSIS — J329 Chronic sinusitis, unspecified: Secondary | ICD-10-CM

## 2022-01-03 LAB — POCT RAPID STREP A, ED / UC: Streptococcus, Group A Screen (Direct): NEGATIVE

## 2022-01-03 MED ORDER — AMOXICILLIN 875 MG PO TABS
875.0000 mg | ORAL_TABLET | Freq: Two times a day (BID) | ORAL | 0 refills | Status: AC
  Filled 2022-01-03: qty 14, 7d supply, fill #0

## 2022-01-03 NOTE — ED Triage Notes (Signed)
3 days ago, Pt reports an onset of fever, cough, congestion and rhinorrhea. 2 days of sore throat and white patches on tonsils.  Tmax 102.2. has been taking tylenol and doing salt water gargles.

## 2022-01-03 NOTE — ED Provider Notes (Signed)
[facility]    CSN: [id number] Arrival date & time: 01/03/22  1138      History   Chief Complaint Chief Complaint  Patient presents with   Sore Throat   12p appt    HPI Kaitlin Bowman is a 51 y.o. female presenting with viral syndrome x4-5 days. History noncontributory as below. Describes nasal congestion, sore throat cough productive of yellow sputum. Temperature as high as 102.2 per home thermometer. Notes tonsils with while drainage overlying. Denies trouble swallowing, SOB, CP, dizziness, weakness. 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      Home Medications    Prior to Admission medications   Medication Sig Start Date End Date Taking? Authorizing Provider  amoxicillin (AMOXIL) 875 MG tablet Take 1 tablet (875 mg total) by mouth 2 (two) times daily for 7 days. 01/03/22 01/10/22 Yes Rhys MartiniGraham, Linette Gunderson E, PA-C  AMBULATORY NON FORMULARY MEDICATION Freestyle lite METER,  test strips and lancets for T2DM to test three times a day. 03/09/20   Jomarie LongsBreeback, Jade L, PA-C  aspirin-acetaminophen-caffeine (EXCEDRIN  MIGRAINE) [id number] MG tablet Take 1 tablet by mouth every 6 (six) hours as needed (for migraines).     [provider]  diclofenac Sodium (VOLTAREN) 1 % GEL Apply 4 g topically to affected joint four times a day. 10/22/21   Breeback, Lesly RubensteinJade L, PA-C  ferrous sulfate 325 (65 FE) MG EC tablet Take 1 tablet (325 mg total) by mouth 2 (two) times daily with a meal. Patient taking differently: Take 325 mg by mouth 2 (two) times daily with a meal. During menstrual cycles 01/14/19   Breeback, Jade L, PA-C  meclizine (ANTIVERT) 25 MG tablet Take 1 tablet (25 mg total) by mouth 3 (three) times daily as needed for dizziness. 12/24/21   Breeback, Jade L, PA-C  metFORMIN (GLUCOPHAGE-XR) 500 MG 24 hr tablet Take 1 tablet (500 mg total) by mouth 2 (two) times daily. 10/23/21   Breeback, Jade L, PA-C  rosuvastatin (CRESTOR) 10 MG tablet Take 1 tablet (10 mg total) by mouth once a week. 04/18/21   Jomarie LongsBreeback, Jade L, PA-C    Family History Family History  Problem Relation Age of Onset   Diabetes Mother 5879   Heart disease Mother    Cancer Mother        cervial   Heart disease Father    Dementia Father    Diabetes Brother    Cancer Maternal Aunt        Thyroid   Heart disease Maternal Grandmother    Anxiety disorder Neg  Hx     Social History Social History   Tobacco Use   Smoking status: Never   Smokeless tobacco: Never  Vaping Use   Vaping Use: Never used  Substance Use Topics   Alcohol use: No   Drug use: No     Allergies   Lipitor [atorvastatin], Lisinopril, Mango flavor [flavoring agent], and Other   Review of Systems Review of Systems  Constitutional:  Negative for appetite change, chills and fever.  HENT:  Positive for congestion and sore throat. Negative for ear pain, rhinorrhea, sinus pressure and sinus pain.   Eyes:  Negative for redness and visual disturbance.  Respiratory:  Negative for cough, chest tightness, shortness of breath and wheezing.   Cardiovascular:  Negative for  chest pain and palpitations.  Gastrointestinal:  Negative for abdominal pain, constipation, diarrhea, nausea and vomiting.  Genitourinary:  Negative for dysuria, frequency and urgency.  Musculoskeletal:  Negative for myalgias.  Neurological:  Negative for dizziness, weakness and headaches.  Psychiatric/Behavioral:  Negative for confusion.   All other systems reviewed and are negative.   Physical Exam Triage Vital Signs ED Triage Vitals  Enc Vitals Group     BP 01/03/22 1205 137/90     Pulse Rate 01/03/22 1205 (!) 104     Resp 01/03/22 1205 18     Temp 01/03/22 1205 98.8 F (37.1 C)     Temp Source 01/03/22 1205 Oral     SpO2 01/03/22 1205 99 %     Weight --      Height --      Head Circumference --      Peak Flow --      Pain Score 01/03/22 1204 8     Pain Loc --      Pain Edu? --      Excl. in GC? --    No data found.  Updated Vital Signs BP 137/90 (BP Location: Left Arm)   Pulse (!) 104   Temp 98.8 F (37.1 C) (Oral)   Resp 18   SpO2 99%   Visual Acuity Right Eye Distance:   Left Eye Distance:   Bilateral Distance:    Right Eye Near:   Left Eye Near:    Bilateral Near:     Physical Exam Vitals reviewed.  Constitutional:      General: She is not in acute distress.    Appearance: Normal appearance. She is not ill-appearing.  HENT:     Head: Normocephalic and atraumatic.     Right Ear: Tympanic membrane, ear canal and external ear normal. No tenderness. No middle ear effusion. There is no impacted cerumen. Tympanic membrane is not perforated, erythematous, retracted or bulging.     Left Ear: Tympanic membrane, ear canal and external ear normal. No tenderness.  No middle ear effusion. There is no impacted cerumen. Tympanic membrane is not perforated, erythematous, retracted or bulging.     Nose: Nose normal. No congestion.     Mouth/Throat:     Mouth: Mucous membranes are moist.     Pharynx: Uvula midline. Posterior oropharyngeal erythema present. No  oropharyngeal exudate.     Comments: Erythema posterior pharynx. Tonsils are small and without exudate. There is some PND and cobblestoning. On exam, uvula is midline, she is tolerating her secretions without difficulty, there is no trismus, no drooling, she has normal phonation  Eyes:     Extraocular Movements: Extraocular movements intact.     Pupils: Pupils are equal, round, and reactive to light.  Cardiovascular:     Rate and Rhythm: Normal rate and regular rhythm.     Heart sounds: Normal heart sounds.  Pulmonary:     Effort: Pulmonary effort is normal.     Breath sounds: Normal breath sounds. No decreased breath sounds, wheezing, rhonchi or rales.  Abdominal:     Palpations: Abdomen is soft.     Tenderness: There is no abdominal tenderness. There is no guarding or rebound.  Lymphadenopathy:     Cervical: No cervical adenopathy.     Right cervical: No superficial cervical adenopathy.    Left cervical: No superficial cervical adenopathy.  Neurological:     General: No focal deficit present.     Mental Status: She is alert and oriented to person, place, and time.  Psychiatric:        Mood and Affect: Mood normal.        Behavior: Behavior normal.        Thought Content: Thought content normal.        Judgment: Judgment normal.     UC Treatments / Results  Labs (all labs ordered are listed, but only abnormal results are displayed) Labs Reviewed  CULTURE, GROUP A STREP [facility])  POCT RAPID STREP A, ED / UC    EKG   Radiology No results found.  Procedures Procedures (including critical care time)  Medications Ordered in UC Medications - No data to display  Initial Impression / Assessment and Plan / UC Course  I have reviewed the triage vital signs and the nursing notes.  Pertinent labs & imaging results that were available during my care of the patient were reviewed by me and considered in my medical decision making (see chart for details).     This patient is a  very pleasant 51 y.o. year old female presenting with viral syndrome. Afebrile, borderline tachy. She notes progressively worsening PND and productive cough. Amoxicillin sent to cover for sinobronchitis. Rapid strep negative, culture sent. ED return precautions discussed. Patient verbalizes understanding and agreement. She is a Engineer, civil (consulting). Coding Level 4 for acute illness with systemic symptoms, and prescription drug management .   Final Clinical Impressions(s) / UC Diagnoses   Final diagnoses:  Sinobronchitis     Discharge Instructions      -Amoxicillin twice daily x7 days -With a virus, you're typically contagious for 5-7 days, or as long as you're having fevers.  -Continue over-the-counter medications if they're helping    ED Prescriptions     Medication Sig Dispense Auth. Provider   amoxicillin (AMOXIL) 875 MG tablet Take 1 tablet (875 mg total) by mouth 2 (two) times daily for 7 days. 14 tablet Rhys Martini, PA-C      PDMP not reviewed this encounter.   Rhys Martini, PA-C 01/03/22 1258

## 2022-01-03 NOTE — Discharge Instructions (Addendum)
-  Amoxicillin twice daily x7 days -With a virus, you're typically contagious for 5-7 days, or as long as you're having fevers.  -Continue over-the-counter medications if they're helping

## 2022-02-25 ENCOUNTER — Other Ambulatory Visit (HOSPITAL_COMMUNITY): Payer: Self-pay

## 2022-03-05 ENCOUNTER — Other Ambulatory Visit (HOSPITAL_COMMUNITY): Payer: Self-pay

## 2022-03-10 ENCOUNTER — Encounter: Payer: Self-pay | Admitting: Physician Assistant

## 2022-03-10 DIAGNOSIS — Z1283 Encounter for screening for malignant neoplasm of skin: Secondary | ICD-10-CM

## 2022-03-14 ENCOUNTER — Ambulatory Visit: Payer: 59

## 2022-03-21 ENCOUNTER — Ambulatory Visit
Admission: RE | Admit: 2022-03-21 | Discharge: 2022-03-21 | Disposition: A | Discharge status: Home / Self Care | Payer: 59 | Source: Ambulatory Visit | Attending: Physician Assistant | Admitting: Physician Assistant

## 2022-03-21 DIAGNOSIS — Z1231 Encounter for screening mammogram for malignant neoplasm of breast: Secondary | ICD-10-CM | POA: Diagnosis not present

## 2022-03-25 NOTE — Progress Notes (Signed)
Normal mammogram. Follow up in 1 year.

## 2022-04-15 ENCOUNTER — Encounter: Payer: Self-pay | Admitting: Physician Assistant

## 2022-04-15 DIAGNOSIS — I10 Essential (primary) hypertension: Secondary | ICD-10-CM

## 2022-04-15 DIAGNOSIS — D508 Other iron deficiency anemias: Secondary | ICD-10-CM

## 2022-04-15 DIAGNOSIS — Z0000 Encounter for general adult medical examination without abnormal findings: Secondary | ICD-10-CM

## 2022-04-15 DIAGNOSIS — E119 Type 2 diabetes mellitus without complications: Secondary | ICD-10-CM

## 2022-04-15 DIAGNOSIS — E785 Hyperlipidemia, unspecified: Secondary | ICD-10-CM

## 2022-04-16 NOTE — Telephone Encounter (Signed)
Please advise if you would like iron studies repeated at this time.  Tiajuana Amass, CMA

## 2022-04-17 DIAGNOSIS — Z01419 Encounter for gynecological examination (general) (routine) without abnormal findings: Secondary | ICD-10-CM | POA: Diagnosis not present

## 2022-04-17 DIAGNOSIS — R102 Pelvic and perineal pain: Secondary | ICD-10-CM | POA: Diagnosis not present

## 2022-04-17 DIAGNOSIS — Z1389 Encounter for screening for other disorder: Secondary | ICD-10-CM | POA: Diagnosis not present

## 2022-04-18 DIAGNOSIS — E119 Type 2 diabetes mellitus without complications: Secondary | ICD-10-CM | POA: Diagnosis not present

## 2022-04-18 DIAGNOSIS — I10 Essential (primary) hypertension: Secondary | ICD-10-CM | POA: Diagnosis not present

## 2022-04-18 DIAGNOSIS — E785 Hyperlipidemia, unspecified: Secondary | ICD-10-CM | POA: Diagnosis not present

## 2022-04-18 DIAGNOSIS — D508 Other iron deficiency anemias: Secondary | ICD-10-CM | POA: Diagnosis not present

## 2022-04-18 DIAGNOSIS — Z0000 Encounter for general adult medical examination without abnormal findings: Secondary | ICD-10-CM | POA: Diagnosis not present

## 2022-04-19 LAB — CBC WITH DIFFERENTIAL/PLATELET
Basophils Absolute: 0.1 10*3/uL (ref 0.0–0.2)
Basos: 2 %
EOS (ABSOLUTE): 0.1 10*3/uL (ref 0.0–0.4)
Eos: 2 %
Hematocrit: 31.2 % — ABNORMAL LOW (ref 34.0–46.6)
Hemoglobin: 9.6 g/dL — ABNORMAL LOW (ref 11.1–15.9)
Immature Grans (Abs): 0 10*3/uL (ref 0.0–0.1)
Immature Granulocytes: 0 %
Lymphocytes Absolute: 1.8 10*3/uL (ref 0.7–3.1)
Lymphs: 38 %
MCH: 22.3 pg — ABNORMAL LOW (ref 26.6–33.0)
MCHC: 30.8 g/dL — ABNORMAL LOW (ref 31.5–35.7)
MCV: 72 fL — ABNORMAL LOW (ref 79–97)
Monocytes Absolute: 0.3 10*3/uL (ref 0.1–0.9)
Monocytes: 6 %
Neutrophils Absolute: 2.3 10*3/uL (ref 1.4–7.0)
Neutrophils: 52 %
Platelets: 647 10*3/uL — ABNORMAL HIGH (ref 150–450)
RBC: 4.31 x10E6/uL (ref 3.77–5.28)
RDW: 15.9 % — ABNORMAL HIGH (ref 11.7–15.4)
WBC: 4.6 10*3/uL (ref 3.4–10.8)

## 2022-04-19 LAB — CMP14+EGFR
ALT: 16 IU/L (ref 0–32)
AST: 20 IU/L (ref 0–40)
Albumin/Globulin Ratio: 1.6 (ref 1.2–2.2)
Albumin: 4.5 g/dL (ref 3.8–4.9)
Alkaline Phosphatase: 66 IU/L (ref 44–121)
BUN/Creatinine Ratio: 16 (ref 9–23)
BUN: 11 mg/dL (ref 6–24)
Bilirubin Total: 0.3 mg/dL (ref 0.0–1.2)
CO2: 24 mmol/L (ref 20–29)
Calcium: 9.1 mg/dL (ref 8.7–10.2)
Chloride: 101 mmol/L (ref 96–106)
Creatinine, Ser: 0.68 mg/dL (ref 0.57–1.00)
Globulin, Total: 2.8 g/dL (ref 1.5–4.5)
Glucose: 116 mg/dL — ABNORMAL HIGH (ref 70–99)
Potassium: 4.4 mmol/L (ref 3.5–5.2)
Sodium: 139 mmol/L (ref 134–144)
Total Protein: 7.3 g/dL (ref 6.0–8.5)
eGFR: 105 mL/min/{1.73_m2} (ref >59)

## 2022-04-19 LAB — LIPID PANEL
Chol/HDL Ratio: 2.6 ratio (ref 0.0–4.4)
Cholesterol, Total: 187 mg/dL (ref 100–199)
HDL: 72 mg/dL (ref >39)
LDL Chol Calc (NIH): 101 mg/dL — ABNORMAL HIGH (ref 0–99)
Triglycerides: 79 mg/dL (ref 0–149)
VLDL Cholesterol Cal: 14 mg/dL (ref 5–40)

## 2022-04-19 LAB — TSH: TSH: 3.75 u[IU]/mL (ref 0.450–4.500)

## 2022-04-19 LAB — HEMOGLOBIN A1C
Est. average glucose Bld gHb Est-mCnc: 166 mg/dL
Hgb A1c MFr Bld: 7.4 % — ABNORMAL HIGH (ref 4.8–5.6)

## 2022-04-22 ENCOUNTER — Ambulatory Visit (INDEPENDENT_AMBULATORY_CARE_PROVIDER_SITE_OTHER): Payer: 59 | Admitting: Physician Assistant

## 2022-04-22 ENCOUNTER — Encounter: Payer: Self-pay | Admitting: Physician Assistant

## 2022-04-22 ENCOUNTER — Other Ambulatory Visit (HOSPITAL_COMMUNITY): Payer: Self-pay

## 2022-04-22 VITALS — BP 132/74 | HR 89 | Wt 139.0 lb

## 2022-04-22 DIAGNOSIS — I10 Essential (primary) hypertension: Secondary | ICD-10-CM | POA: Diagnosis not present

## 2022-04-22 DIAGNOSIS — Z1211 Encounter for screening for malignant neoplasm of colon: Secondary | ICD-10-CM | POA: Diagnosis not present

## 2022-04-22 DIAGNOSIS — E785 Hyperlipidemia, unspecified: Secondary | ICD-10-CM

## 2022-04-22 DIAGNOSIS — D508 Other iron deficiency anemias: Secondary | ICD-10-CM

## 2022-04-22 DIAGNOSIS — Z0000 Encounter for general adult medical examination without abnormal findings: Secondary | ICD-10-CM

## 2022-04-22 DIAGNOSIS — E119 Type 2 diabetes mellitus without complications: Secondary | ICD-10-CM | POA: Diagnosis not present

## 2022-04-22 LAB — POCT UA - MICROALBUMIN
Albumin/Creatinine Ratio, Urine, POC: 30
Creatinine, POC: 100 mg/dL
Microalbumin Ur, POC: 10 mg/L

## 2022-04-22 MED ORDER — AMBULATORY NON FORMULARY MEDICATION: 3 refills | Status: AC

## 2022-04-22 MED ORDER — DAPAGLIFLOZIN PROPANEDIOL 10 MG PO TABS
10.0000 mg | ORAL_TABLET | Freq: Every day | ORAL | 0 refills | Status: DC
  Filled 2022-04-22: qty 90, 90d supply, fill #0

## 2022-04-22 MED ORDER — FERROUS SULFATE 325 (65 FE) MG PO TBEC
325.0000 mg | DELAYED_RELEASE_TABLET | Freq: Two times a day (BID) | ORAL | 2 refills | Status: AC
  Filled 2022-04-22: qty 60, 30d supply, fill #0

## 2022-04-22 MED ORDER — ROSUVASTATIN CALCIUM 10 MG PO TABS
ORAL_TABLET | ORAL | 3 refills | Status: DC
  Filled 2022-04-22: qty 24, 84d supply, fill #0
  Filled 2022-04-22: qty 24, 24d supply, fill #0
  Filled 2022-08-21: qty 24, 84d supply, fill #1
  Filled 2022-12-02: qty 24, 84d supply, fill #2
  Filled 2023-03-22: qty 24, 84d supply, fill #3

## 2022-04-22 MED ORDER — GLUCOSE BLOOD VI STRP
ORAL_STRIP | 3 refills | Status: DC
Start: 2022-04-22 — End: 2023-05-04
  Filled 2022-04-22: qty 100, 33d supply, fill #0
  Filled 2022-08-21: qty 100, 33d supply, fill #1

## 2022-04-22 MED ORDER — FREESTYLE LANCETS MISC
3 refills | Status: DC
  Filled 2022-04-22: qty 100, 33d supply, fill #0
  Filled 2022-08-21: qty 100, 33d supply, fill #1

## 2022-04-22 NOTE — Progress Notes (Signed)
Do you have an upcoming appt?   Hemoglobin still low showing anemia.  A1C not to goal. Anything above 7 not controlled.  Cholesterol LDL goal not to primary prevention goal.   We need to talk about medications.

## 2022-04-22 NOTE — Progress Notes (Signed)
Complete physical exam  Patient: Kaitlin Bowman   DOB: [birth year]   51 y.o. Female  MRN: [id number]  Subjective:    Chief Complaint  Patient presents with   Annual Exam    Kaitlin Bowman is a 51 y.o. female who presents today for a complete physical exam. She reports consuming a general and she attempts to eat less sugars and carbs  diet. The patient does not participate in regular exercise at present. She generally feels well. She reports sleeping well. She does not have additional problems to discuss today.      Most recent fall risk assessment:    04/22/2022   10:55 AM  Fall Risk   Falls in the past year? 0  Number falls in past yr: 0  Injury with Fall? 0  Risk for fall due to : No Fall Risks  Follow up Falls evaluation completed     Most recent depression screenings:    04/22/2022   10:55 AM 04/18/2021   11:19 AM  PHQ 2/9 Scores  PHQ - 2 Score 2 2  PHQ- 9 Score 6 6    Vision:Within last year and Dental: No current dental problems  Patient Active Problem List   Diagnosis Date Noted   Calcification of tendon 10/02/2020   Lumbar facet arthropathy 10/01/2020   Palpitations 03/12/2020   Microalbuminuria 01/14/2019   Hyperlipidemia LDL goal <70 01/14/2019   Anemia 01/11/2019   Irregular periods 01/26/2018   Goiter 12/11/2017   Anemia, iron deficiency 12/11/2017   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   Vitamin D deficiency 12/11/2017   Graves disease 12/11/2017   Vertigo 12/11/2017   Type 2 diabetes mellitus without complication, without long-term current use of insulin (HCC) 12/01/2017   Past Medical History:  Diagnosis Date   Anemia    Diabetes mellitus without complication (HCC)    Menorrhagia    Thyroid disease    hyperthyroidism   Family History  Problem Relation Age of Onset   Diabetes Mother 30   Heart disease Mother    Cancer Mother        cervial   Heart disease Father    Dementia Father    Diabetes Brother    Cancer Maternal Aunt         Thyroid   Heart disease Maternal Grandmother    Anxiety disorder Neg Hx    Allergies  Allergen Reactions   Lipitor [Atorvastatin]     myalgias   Lisinopril     cough   Mango Flavor [Flavoring Agent] Swelling and Other (See Comments)    Mouth and airway both swell (no shortness of breath, however) Blisters, also   Other Anxiety    Med (name not recalled by the patient) that treated nausea/vomiting- NOT Promethazine, Compazine, or Zofran; I asked)      Patient Care Team: Nolene Ebbs as PCP - General (Family Medicine)   Outpatient Medications Prior to Visit  Medication Sig   aspirin-acetaminophen-caffeine (EXCEDRIN MIGRAINE) 250-250-65 MG tablet Take 1 tablet by mouth every 6 (six) hours as needed (for migraines).    diclofenac Sodium (VOLTAREN) 1 % GEL Apply 4 g topically to affected joint four times a day.   meclizine (ANTIVERT) 25 MG tablet Take 1 tablet (25 mg total) by mouth 3 (three) times daily as needed for dizziness.   [DISCONTINUED] AMBULATORY NON FORMULARY MEDICATION Freestyle lite METER,  test strips and lancets for T2DM to test three times a day.   [DISCONTINUED] ferrous  sulfate 325 (65 FE) MG EC tablet Take 1 tablet (325 mg total) by mouth 2 (two) times daily with a meal. (Patient taking differently: Take 325 mg by mouth 2 (two) times daily with a meal. During menstrual cycles)   [DISCONTINUED] metFORMIN (GLUCOPHAGE-XR) 500 MG 24 hr tablet Take 1 tablet (500 mg total) by mouth 2 (two) times daily.   [DISCONTINUED] rosuvastatin (CRESTOR) 10 MG tablet Take 1 tablet (10 mg total) by mouth once a week.   [DISCONTINUED] lisinopril (ZESTRIL) 5 MG tablet daily. (Patient not taking: Reported on 04/22/2022)   No facility-administered medications prior to visit.    Review of Systems  All other systems reviewed and are negative.         Objective:     BP 132/74   Pulse 89   Wt 139 lb (63 kg)   SpO2 97%   BMI 24.62 kg/m  BP Readings from Last 3 Encounters:   04/22/22 132/74  01/03/22 137/90  10/22/21 130/73   Wt Readings from Last 3 Encounters:  04/22/22 139 lb (63 kg)  10/22/21 141 lb (64 kg)  04/18/21 138 lb (62.6 kg)      Physical Exam  BP 132/74   Pulse 89   Wt 139 lb (63 kg)   SpO2 97%   BMI 24.62 kg/m   General Appearance:    Alert, cooperative, no distress, appears stated age  Head:    Normocephalic, without obvious abnormality, atraumatic  Eyes:    PERRL, conjunctiva/corneas clear, EOM's intact, fundi    benign, both eyes  Ears:    Normal TM's and external ear canals, both ears  Nose:   Nares normal, septum midline, mucosa normal, no drainage    or sinus tenderness  Throat:   Lips, mucosa, and tongue normal; teeth and gums normal  Neck:   Supple, symmetrical, trachea midline, no adenopathy;    thyroid:  no enlargement/tenderness/nodules; no carotid   bruit or JVD  Back:     Symmetric, no curvature, ROM normal, no CVA tenderness  Lungs:     Clear to auscultation bilaterally, respirations unlabored  Chest Wall:    No tenderness or deformity   Heart:    Regular rate and rhythm, S1 and S2 normal, no murmur, rub   or gallop     Abdomen:     Soft, non-tender, bowel sounds active all four quadrants,    no masses, no organomegaly        Extremities:   Extremities normal, atraumatic, no cyanosis or edema  Pulses:   2+ and symmetric all extremities  Skin:   Skin color, texture, turgor normal, no rashes or lesions  Lymph nodes:   Cervical, supraclavicular, and axillary nodes normal  Neurologic:   CNII-XII intact, normal strength, sensation and reflexes    throughout       Assessment & Plan:    Routine Health Maintenance and Physical Exam  Immunization History  Administered Date(s) Administered   Influenza-Unspecified 06/20/2015, 06/12/2019, 06/01/2020   PFIZER(Purple Top)SARS-COV-2 Vaccination 09/12/2019, 10/03/2019   Td 02/08/1998   Tdap 03/29/2013   Zoster, Live 02/26/2004, 02/25/2010    Health Maintenance   Topic Date Due   COLONOSCOPY (Pts 45-40yrs Insurance coverage will need to be confirmed)  Never done   COVID-19 Vaccine (3 - Pfizer risk series) 05/08/2022 (Originally 10/31/2019)   INFLUENZA VACCINE  11/16/2022 (Originally 03/18/2022)   HIV Screening  04/23/2023 (Originally 03/22/1985)   OPHTHALMOLOGY EXAM  08/29/2022   HEMOGLOBIN A1C  10/17/2022  MAMMOGRAM  03/22/2023   TETANUS/TDAP  03/30/2023   Diabetic kidney evaluation - GFR measurement  04/19/2023   Diabetic kidney evaluation - Urine ACR  04/23/2023   FOOT EXAM  04/23/2023   PAP SMEAR-Modifier  02/29/2024   Hepatitis C Screening  Completed   HPV VACCINES  Aged Out   Zoster Vaccines- Shingrix  Discontinued    Discussed health benefits of physical activity, and encouraged her to engage in regular exercise appropriate for her age and condition.  Marland KitchenWestley Gambles was seen today for annual exam.  Diagnoses and all orders for this visit:  Routine physical examination  Hyperlipidemia LDL goal <70 -     rosuvastatin (CRESTOR) 10 MG tablet; Take one tablet by mouth twice a week.  Type 2 diabetes mellitus without complication, without long-term current use of insulin (HCC) -     POCT UA - Microalbumin -     dapagliflozin propanediol (FARXIGA) 10 MG TABS tablet; Take 1 tablet (10 mg total) by mouth daily. -     AMBULATORY NON FORMULARY MEDICATION; Freestyle lite  test strips and lancets for T2DM to test three times a day.  Essential hypertension  Colon cancer screening -     Ambulatory referral to Gastroenterology  Other iron deficiency anemia -     ferrous sulfate 325 (65 FE) MG EC tablet; Take 1 tablet (325 mg total) by mouth 2 (two) times daily with a meal. During menstrual cycles   .Marland Kitchen Discussed 150 minutes of exercise a week.  Encouraged vitamin D 1000 units and Calcium 1300mg  or 4 servings of dairy a day.  Fasting labs done and discussed in office Elevated A1C- on metformin discussed addig farxiga. Pt agreed to this Recheck in  3 months No protein in urine Continue to check sugars at home. Increase crestor to twice a week. LdL goal under 70.  BP very close to goal. Iron low-increased iron to bid with meals Pap UTD. Mammogram UTD. Colonoscopy ordered and discussed importance of getting this done. Will get flu shot at work. Declined shingrix and covid    Return in about 3 months (around 07/22/2022) for DM follow up.     14/12/2021, PA-C

## 2022-04-22 NOTE — Patient Instructions (Signed)

## 2022-04-23 ENCOUNTER — Other Ambulatory Visit (HOSPITAL_COMMUNITY): Payer: Self-pay

## 2022-04-25 DIAGNOSIS — N840 Polyp of corpus uteri: Secondary | ICD-10-CM | POA: Diagnosis not present

## 2022-05-13 ENCOUNTER — Encounter: Payer: Self-pay | Admitting: Gastroenterology

## 2022-05-17 IMAGING — MG DIGITAL SCREENING BILAT W/ TOMO W/ CAD
6 of 10 series · 6 of 30 positions shown · non-contrast
Comparison: Previous exam(s).

CLINICAL DATA: Screening.

EXAM:
DIGITAL SCREENING BILATERAL MAMMOGRAM WITH TOMO AND CAD

[R MLO synth-2D]
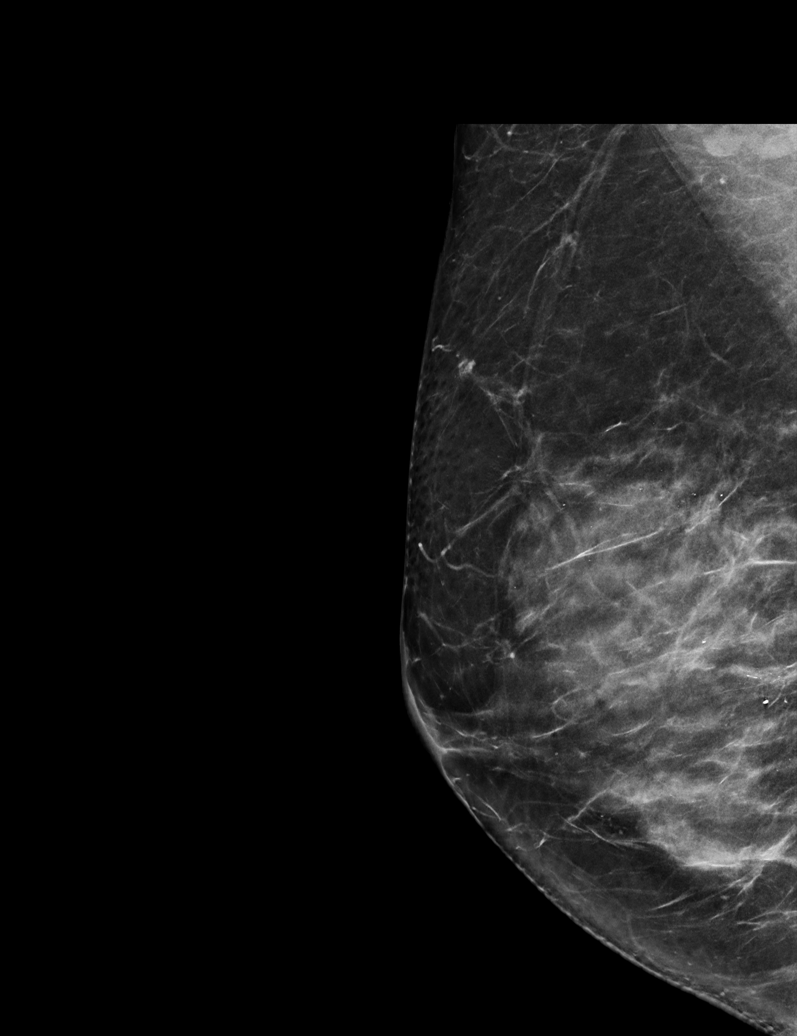

[R CC synth-2D]
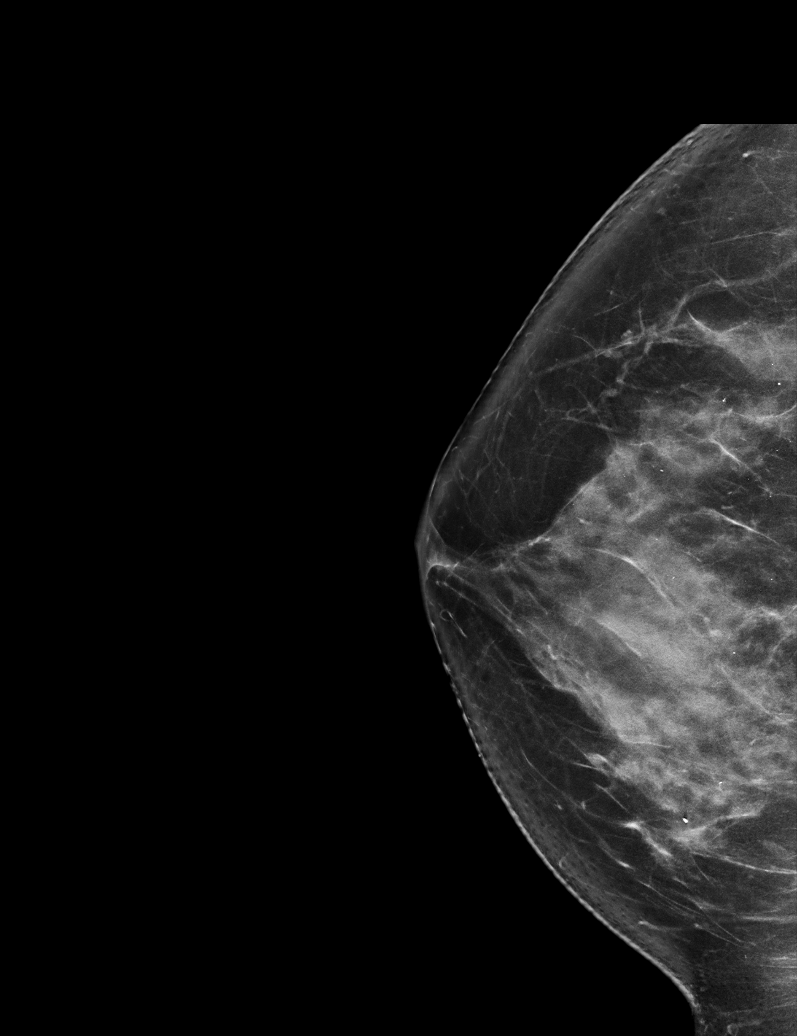

[L CC synth-2D]
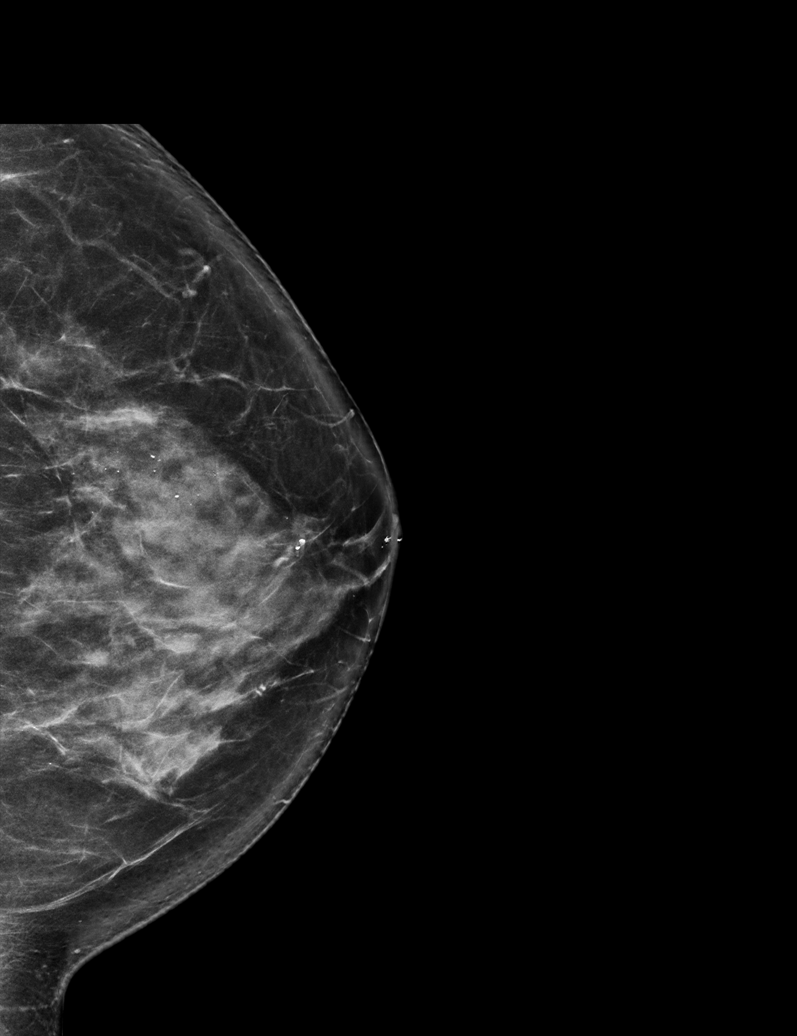

[L MLO synth-2D (1 of 2)]
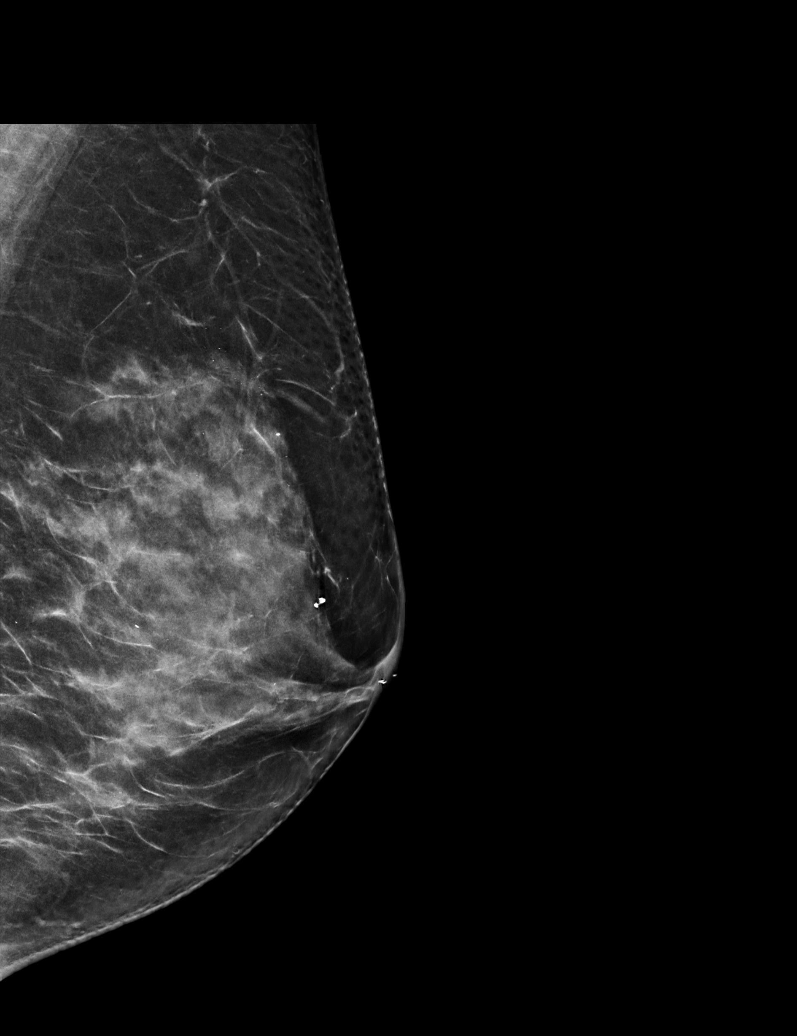

[L MLO synth-2D (2 of 2)]
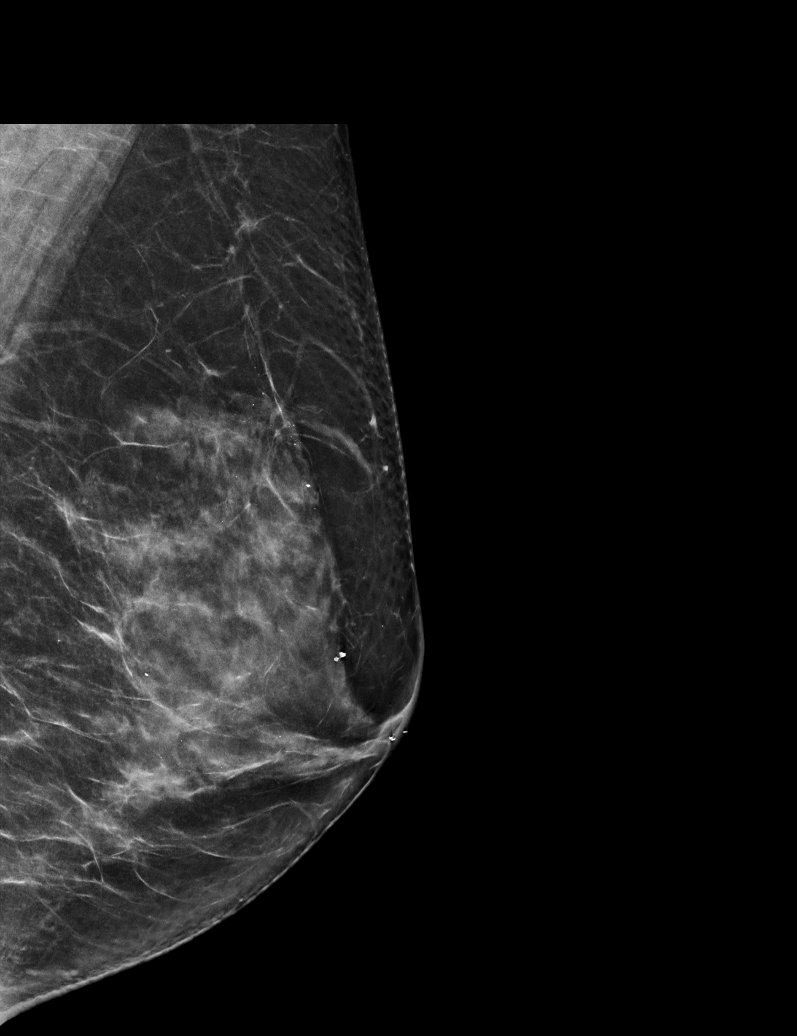

[L MLO tomo · tomo slice 35/69.0]
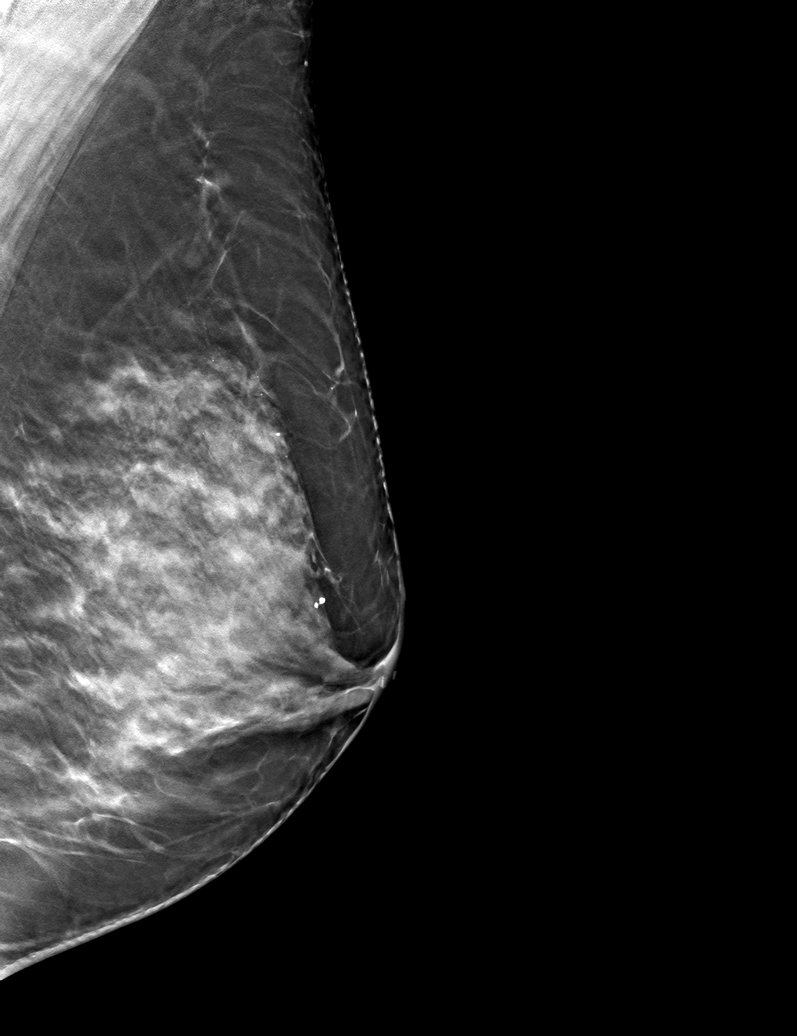

[6 of 30 positions shown; findings below may reference images not displayed]

ACR Breast Density Category c: The breast tissue is heterogeneously
dense, which may obscure small masses.
FINDINGS: There are no findings suspicious for malignancy. Images were
processed with CAD.
IMPRESSION: No mammographic evidence of malignancy. A result letter of this
screening mammogram will be mailed directly to the patient.

RECOMMENDATION:
Screening mammogram in one year. (Code:[id number])

BI-RADS CATEGORY  1: Negative.

## 2022-05-21 ENCOUNTER — Other Ambulatory Visit (HOSPITAL_COMMUNITY): Payer: Self-pay

## 2022-05-22 ENCOUNTER — Ambulatory Visit (AMBULATORY_SURGERY_CENTER): Payer: Self-pay | Admitting: *Deleted

## 2022-05-22 ENCOUNTER — Encounter: Payer: Self-pay | Admitting: Physician Assistant

## 2022-05-22 ENCOUNTER — Other Ambulatory Visit (HOSPITAL_COMMUNITY): Payer: Self-pay

## 2022-05-22 VITALS — Ht 63.0 in | Wt 136.6 lb

## 2022-05-22 DIAGNOSIS — Z1211 Encounter for screening for malignant neoplasm of colon: Secondary | ICD-10-CM

## 2022-05-22 MED ORDER — METFORMIN HCL ER 500 MG PO TB24
500.0000 mg | ORAL_TABLET | Freq: Two times a day (BID) | ORAL | 1 refills | Status: DC
  Filled 2022-05-22: qty 180, 90d supply, fill #0
  Filled 2022-08-21: qty 180, 90d supply, fill #1

## 2022-05-22 MED ORDER — NA SULFATE-K SULFATE-MG SULF 17.5-3.13-1.6 GM/177ML PO SOLN
1.0000 | Freq: Once | ORAL | 0 refills | Status: AC
  Filled 2022-05-22: qty 354, 1d supply, fill #0

## 2022-05-22 NOTE — Progress Notes (Signed)
No egg or soy allergy known to patient  No issues known to pt with past sedation with any surgeries or procedures Patient denies ever being told they had issues or difficulty with intubation  No FH of Malignant Hyperthermia Pt is not on diet pills Pt is not on home 02  Pt is not on blood thinners  Pt denies issues with constipation  No A fib or A flutter Have any cardiac testing pending--NO Pt instructed to use [email or web address] or GoodRx for a price reduction on prep   

## 2022-05-26 ENCOUNTER — Other Ambulatory Visit (HOSPITAL_COMMUNITY): Payer: Self-pay

## 2022-06-09 ENCOUNTER — Encounter: Payer: Self-pay | Admitting: Gastroenterology

## 2022-06-17 ENCOUNTER — Ambulatory Visit (AMBULATORY_SURGERY_CENTER): Payer: 59 | Admitting: Gastroenterology

## 2022-06-17 ENCOUNTER — Encounter: Payer: Self-pay | Admitting: Gastroenterology

## 2022-06-17 VITALS — BP 112/76 | HR 67 | Temp 97.4°F | Resp 11 | Ht 63.0 in | Wt 136.6 lb

## 2022-06-17 DIAGNOSIS — Z1211 Encounter for screening for malignant neoplasm of colon: Secondary | ICD-10-CM

## 2022-06-17 HISTORY — PX: COLONOSCOPY WITH PROPOFOL: SHX5780

## 2022-06-17 MED ORDER — SODIUM CHLORIDE 0.9 % IV SOLN: 500.0000 mL | Freq: Once | INTRAVENOUS | Status: DC

## 2022-06-17 NOTE — Progress Notes (Signed)
Vss nad trans to pacu °

## 2022-06-17 NOTE — Patient Instructions (Signed)
Try to eat a high fiber diet., and drink plenty of water.  Resume all of your previous medications.  Read all of the handouts given to you by your recovery room nurse.  YOU HAD AN ENDOSCOPIC PROCEDURE TODAY AT [facility]:   Refer to the procedure report that was given to you for any specific questions about what was found during the examination.  If the procedure report does not answer your questions, please call your gastroenterologist to clarify.  If you requested that your care partner not be given the details of your procedure findings, then the procedure report has been included in a sealed envelope for you to review at your convenience later.  YOU SHOULD EXPECT: Some feelings of bloating in the abdomen. Passage of more gas than usual.  Walking can help get rid of the air that was put into your GI tract during the procedure and reduce the bloating. If you had a lower endoscopy (such as a colonoscopy or flexible sigmoidoscopy) you may notice spotting of blood in your stool or on the toilet paper. If you underwent a bowel prep for your procedure, you may not have a normal bowel movement for a few days.  Please Note:  You might notice some irritation and congestion in your nose or some drainage.  This is from the oxygen used during your procedure.  There is no need for concern and it should clear up in a day or so.  SYMPTOMS TO REPORT IMMEDIATELY:  Following lower endoscopy (colonoscopy or flexible sigmoidoscopy):  Excessive amounts of blood in the stool  Significant tenderness or worsening of abdominal pains  Swelling of the abdomen that is new, acute  Fever of 100F or higher   For urgent or emergent issues, a gastroenterologist can be reached at any hour by calling [phone]. Do not use MyChart messaging for urgent concerns.    DIET:  We do recommend a small meal at first, but then you may proceed to your regular diet.  Drink plenty of fluids but you should avoid  alcoholic beverages for 24 hours.  ACTIVITY:  You should plan to take it easy for the rest of today and you should NOT DRIVE or use heavy machinery until tomorrow (because of the sedation medicines used during the test).    FOLLOW UP: Our staff will call the number listed on your records the next business day following your procedure.  We will call around 7:15- 8:00 am to check on you and address any questions or concerns that you may have regarding the information given to you following your procedure. If we do not reach you, we will leave a message.      SIGNATURES/CONFIDENTIALITY: You and/or your care partner have signed paperwork which will be entered into your electronic medical record.  These signatures attest to the fact that that the information above on your After Visit Summary has been reviewed and is understood.  Full responsibility of the confidentiality of this discharge information lies with you and/or your care-partner.

## 2022-06-17 NOTE — Op Note (Signed)
[place] Patient Name: Kaitlin Bowman Procedure Date: 06/17/2022 10:28 AM MRN: [id number] Endoscopist: Thornton Park MD, MD, [id number] Age: 51 Referring MD:  Date of Birth: 02-Oct-1970 Gender: Female Account #: [id number] Procedure:                Colonoscopy Indications:              Screening for colorectal malignant neoplasm, This                            is the patient's first colonoscopy Medicines:                Monitored Anesthesia Care Procedure:                Pre-Anesthesia Assessment:                           - Prior to the procedure, a History and Physical                            was performed, and patient medications and                            allergies were reviewed. The patient's tolerance of                            previous anesthesia was also reviewed. The risks                            and benefits of the procedure and the sedation                            options and risks were discussed with the patient.                            All questions were answered, and informed consent                            was obtained. Prior Anticoagulants: The patient has                            taken no anticoagulant or antiplatelet agents. ASA                            Grade Assessment: II - A patient with mild systemic                            disease. After reviewing the risks and benefits,                            the patient was deemed in satisfactory condition to                            undergo the procedure.  After obtaining informed consent, the colonoscope                            was passed under direct vision. Throughout the                            procedure, the patient's blood pressure, pulse, and                            oxygen saturations were monitored continuously. The                            Olympus CF-HQ190L (Serial# [id number]) Colonoscope was                            introduced through  the anus and advanced to the 3                            cm into the ileum. A second forward view of the                            right colon was performed. The colonoscopy was                            performed without difficulty. The patient tolerated                            the procedure well. The quality of the bowel                            preparation was good. The terminal ileum, ileocecal                            valve, appendiceal orifice, and rectum were                            photographed. Scope In: 10:38:04 AM Scope Out: 10:54:21 AM Scope Withdrawal Time: 0 hours 10 minutes 26 seconds  Total Procedure Duration: 0 hours 16 minutes 17 seconds  Findings:                 The perianal and digital rectal examinations were                            normal.                           The colon (entire examined portion) appeared normal.                           No additional abnormalities were found on                            retroflexion. Complications:            No immediate complications. Estimated  Blood Loss:     Estimated blood loss: none. Impression:               - The entire examined colon is normal.                           - No specimens collected. Recommendation:           - Patient has a contact number available for                            emergencies. The signs and symptoms of potential                            delayed complications were discussed with the                            patient. Return to normal activities tomorrow.                            Written discharge instructions were provided to the                            patient.                           - Resume previous diet.                           - Continue present medications.                           - Repeat colonoscopy in 10 years for surveillance,                            earlier with new symptoms.                           - Emerging evidence supports eating a diet  of                            fruits, vegetables, grains, calcium, and yogurt                            while reducing red meat and alcohol may reduce the                            risk of colon cancer.                           - Thank you for allowing me to be involved in your                            colon cancer prevention. Thornton Park MD, MD 06/17/2022 11:01:50 AM This report has been signed electronically.

## 2022-06-17 NOTE — Progress Notes (Signed)
Pt's states no medical or surgical changes since previsit or office visit. 

## 2022-06-17 NOTE — Progress Notes (Signed)
Referring Provider: Lavada Mesi Primary Care Physician:  Donella Stade, PA-C  Indication for Colonoscopy:  Colon cancer screening   IMPRESSION:  Need for colon cancer screening Appropriate candidate for monitored anesthesia care  PLAN: Colonoscopy in the [place] today   HPI: Kaitlin Bowman is a 51 y.o. female presents for screening colonoscopy.  No prior colonoscopy or colon cancer screening.  No known family history of colon cancer or polyps. No family history of uterine/endometrial cancer, pancreatic cancer or gastric/stomach cancer.   Past Medical History:  Diagnosis Date   Allergy    "POLLEN"   Anemia    Diabetes mellitus without complication (HCC)    Hyperlipidemia    Menorrhagia    Thyroid disease    hyperthyroidism    Past Surgical History:  Procedure Laterality Date   DILATION AND CURETTAGE OF UTERUS     POLYPS REMOVED,2016    Current Outpatient Medications  Medication Sig Dispense Refill   AMBULATORY NON FORMULARY MEDICATION Freestyle lite  test strips and lancets for T2DM to test three times a day. 100 each 3   ferrous sulfate 325 (65 FE) MG EC tablet Take 1 tablet (325 mg total) by mouth 2 (two) times daily with a meal. During menstrual cycles 60 tablet 2   glucose blood test strip Test 3 times a day 100 each 3   Lancets (FREESTYLE) lancets Use to test 3 times a day 100 each 3   meclizine (ANTIVERT) 25 MG tablet Take 1 tablet (25 mg total) by mouth 3 (three) times daily as needed for dizziness. 30 tablet 1   metFORMIN (GLUCOPHAGE-XR) 500 MG 24 hr tablet Take 1 tablet (500 mg total) by mouth 2 (two) times daily. 180 tablet 1   rosuvastatin (CRESTOR) 10 MG tablet Take one tablet by mouth twice a week. 24 tablet 3   ascorbic acid (VITAMIN C) 500 MG tablet Take 500 mg by mouth daily.     aspirin-acetaminophen-caffeine (EXCEDRIN MIGRAINE) 250-250-65 MG tablet Take 1 tablet by mouth every 6 (six) hours as needed (for migraines).      dapagliflozin  propanediol (FARXIGA) 10 MG TABS tablet Take 1 tablet (10 mg total) by mouth daily. (Patient not taking: Reported on 05/22/2022) 90 tablet 0   diclofenac Sodium (VOLTAREN) 1 % GEL Apply 4 g topically to affected joint four times a day. (Patient not taking: Reported on 05/22/2022) 100 g 1   Multiple Vitamins-Minerals (MULTIVITAMIN ADULTS PO) Take by mouth daily. CENTRUM SILVER     Current Facility-Administered Medications  Medication Dose Route Frequency Provider Last Rate Last Admin   0.9 %  sodium chloride infusion  500 mL Intravenous Once Thornton Park, MD        Allergies as of 06/17/2022 - Review Complete 06/17/2022  Allergen Reaction Noted   Lipitor [atorvastatin]  10/02/2020   Lisinopril  08/05/2019   Mango flavor [flavoring agent] Swelling and Other (See Comments) 07/29/2017   Other Anxiety 07/29/2017    Family History  Problem Relation Age of Onset   Diabetes Mother 59   Heart disease Mother    Cancer Mother        cervial   Heart disease Father    Dementia Father    Diabetes Brother    Cancer Maternal Aunt        Thyroid   Heart disease Maternal Grandmother    Anxiety disorder Neg Hx    Colon cancer Neg Hx    Colon polyps Neg Hx  Crohn's disease Neg Hx    Esophageal cancer Neg Hx    Rectal cancer Neg Hx    Stomach cancer Neg Hx    Ulcerative colitis Neg Hx      Physical Exam: General:   Alert,  well-nourished, pleasant and cooperative in NAD Head:  Normocephalic and atraumatic. Eyes:  Sclera clear, no icterus.   Conjunctiva pink. Mouth:  No deformity or lesions.   Neck:  Supple; no masses or thyromegaly. Lungs:  Clear throughout to auscultation.   No wheezes. Heart:  Regular rate and rhythm; no murmurs. Abdomen:  Soft, non-tender, nondistended, normal bowel sounds, no rebound or guarding.  Msk:  Symmetrical. No boney deformities LAD: No inguinal or umbilical LAD Extremities:  No clubbing or edema. Neurologic:  Alert and  oriented x4;  grossly  nonfocal Skin:  No obvious rash or bruise. Psych:  Alert and cooperative. Normal mood and affect.     Studies/Results: No results found.    Phil Michels L. Orvan Falconer, MD, MPH 06/17/2022, 10:19 AM

## 2022-06-18 ENCOUNTER — Telehealth: Payer: Self-pay

## 2022-06-18 NOTE — Telephone Encounter (Signed)
Post procedure follow up call, no answer 

## 2022-07-15 ENCOUNTER — Encounter: Payer: Self-pay | Admitting: Physician Assistant

## 2022-07-15 DIAGNOSIS — E119 Type 2 diabetes mellitus without complications: Secondary | ICD-10-CM

## 2022-07-15 DIAGNOSIS — I10 Essential (primary) hypertension: Secondary | ICD-10-CM

## 2022-07-15 NOTE — Telephone Encounter (Signed)
Labs ordered.  But just so she knows insurance may or may not cover it especially if she is checked the same labs within a short amount of time.

## 2022-07-18 DIAGNOSIS — Z01818 Encounter for other preprocedural examination: Secondary | ICD-10-CM | POA: Diagnosis not present

## 2022-07-22 ENCOUNTER — Ambulatory Visit: Payer: 59 | Admitting: Physician Assistant

## 2022-07-24 DIAGNOSIS — E119 Type 2 diabetes mellitus without complications: Secondary | ICD-10-CM | POA: Diagnosis not present

## 2022-07-24 DIAGNOSIS — I10 Essential (primary) hypertension: Secondary | ICD-10-CM | POA: Diagnosis not present

## 2022-07-25 LAB — CBC WITH DIFFERENTIAL/PLATELET
Basophils Absolute: 0.1 10*3/uL (ref 0.0–0.2)
Basos: 2 %
EOS (ABSOLUTE): 0.1 10*3/uL (ref 0.0–0.4)
Eos: 1 %
Hematocrit: 36.1 % (ref 34.0–46.6)
Hemoglobin: 11.2 g/dL (ref 11.1–15.9)
Immature Grans (Abs): 0 10*3/uL (ref 0.0–0.1)
Immature Granulocytes: 0 %
Lymphocytes Absolute: 2.1 10*3/uL (ref 0.7–3.1)
Lymphs: 35 %
MCH: 23.7 pg — ABNORMAL LOW (ref 26.6–33.0)
MCHC: 31 g/dL — ABNORMAL LOW (ref 31.5–35.7)
MCV: 77 fL — ABNORMAL LOW (ref 79–97)
Monocytes Absolute: 0.4 10*3/uL (ref 0.1–0.9)
Monocytes: 7 %
Neutrophils Absolute: 3.4 10*3/uL (ref 1.4–7.0)
Neutrophils: 55 %
Platelets: 428 10*3/uL (ref 150–450)
RBC: 4.72 x10E6/uL (ref 3.77–5.28)
RDW: 19.2 % — ABNORMAL HIGH (ref 11.7–15.4)
WBC: 6.1 10*3/uL (ref 3.4–10.8)

## 2022-07-25 LAB — HEMOGLOBIN A1C
Est. average glucose Bld gHb Est-mCnc: 151 mg/dL
Hgb A1c MFr Bld: 6.9 % — ABNORMAL HIGH (ref 4.8–5.6)

## 2022-07-25 LAB — TSH: TSH: 3.05 u[IU]/mL (ref 0.450–4.500)

## 2022-07-25 NOTE — Progress Notes (Signed)
Hello, I am covering for [facility] while she is out of the office.  Your A1c looks better this time at 6.9.  It is down from 7.4 which is great progress!  Just continue to work on healthy food choices and regular exercise.  Your hemoglobin looks much better as well it is up to 11.2 and your red blood cells are looking better as well.  If you are taking extra iron please continue and we will plan to recheck again in 3 months.  Thyroid level looks great.

## 2022-07-29 ENCOUNTER — Encounter (HOSPITAL_BASED_OUTPATIENT_CLINIC_OR_DEPARTMENT_OTHER): Payer: Self-pay | Admitting: Obstetrics and Gynecology

## 2022-07-29 NOTE — Progress Notes (Signed)
Spoke w/ via phone for pre-op interview--- pt Lab needs dos----  cbc, cmp, urine preg, ekg             Lab results------ no COVID test -----patient states asymptomatic no test needed Arrive at ------- 0800 on 07-31-2022 NPO after MN NO Solid Food.  Clear liquids from MN until--- 0700 Med rec completed Medications to take morning of surgery ----- none Diabetic medication ----- do not take metformin morning of surgery Patient instructed no nail polish to be worn day of surgery Patient instructed to bring photo id and insurance card day of surgery Patient aware to have Driver (ride ) / caregiver for 24 hours after surgery --- friend, carol Patient Special Instructions ----- n/a Pre-Op special Istructions ----- n/a Patient verbalized understanding of instructions that were given at this phone interview. Patient denies shortness of breath, chest pain, fever, cough at this phone interview.

## 2022-07-30 NOTE — H&P (Signed)
Kaitlin Bowman is an 51 y.o. female G0 with history of uterine polyps.  Recurrance on [place] - plan for hysteroscopy, D&C possible myosure removal of polyps.  D/W pt r/b/a of surgery - also process and expectations.  Questions answered, will proceed.    Pertinent Gynecological History: G0 No abn pap, last 7/20 HR HPV neg No STI MMG 8/23  Menstrual History:  Patient's last menstrual period was 07/07/2022 (approximate).    Past Medical History:  Diagnosis Date   Endometrial polyp    Environmental allergies    Goiter with hyperthyroidism 2009   (07-29-2022 per pt had taken medication for 2-3 wks and labs ok , pcp continues to monitor, no meds)   History of positive PPD 2015   per pt was false positive ,  blood test was negative   Hyperlipidemia    Hypertension    followed by pcp--- per pt monitoring at home, no med   IDA (iron deficiency anemia)    Intermittent palpitations    per pt once in a while   Menorrhagia    Type 2 diabetes mellitus (HCC)    followed by pcp  (07-29-2022  per pt checks blood sugar once a month)   Vertigo    Wears glasses     Past Surgical History:  Procedure Laterality Date   COLONOSCOPY WITH PROPOFOL  06/17/2022   dr beavers   HYSTEROSCOPY WITH D & C  2016   POLYPS REMOVED    Family History  Problem Relation Age of Onset   Diabetes Mother 29   Heart disease Mother    Cancer Mother        cervial   Heart disease Father    Dementia Father    Diabetes Brother    Cancer Maternal Aunt        Thyroid   Heart disease Maternal Grandmother    Anxiety disorder Neg Hx    Colon cancer Neg Hx    Colon polyps Neg Hx    Crohn's disease Neg Hx    Esophageal cancer Neg Hx    Rectal cancer Neg Hx    Stomach cancer Neg Hx    Ulcerative colitis Neg Hx     Social History:  reports that she has never smoked. She has never been exposed to tobacco smoke. She has never used smokeless tobacco. She reports that she does not drink alcohol and does not use  drugs.single, nurse at [employer]  Allergies:  Allergies  Allergen Reactions   Farxiga [Dapagliflozin]     Per pt near syncope   Lipitor [Atorvastatin]     myalgias   Lisinopril     cough   Mango Flavor [Flavoring Agent] Swelling and Other (See Comments)    Mouth and airway both swell (no shortness of breath, however) Blisters, also   Other Anxiety    Med (name not recalled by the patient) that treated nausea/vomiting- NOT Promethazine, Compazine, or Zofran; I asked) per pt this happened approx 2006    Meds Iron, meclizine, metformin, rousavastatin  Review of Systems  Constitutional: Negative.   HENT: Negative.    Respiratory: Negative.    Cardiovascular: Negative.   Gastrointestinal: Negative.   Genitourinary:  Positive for menstrual problem.  Musculoskeletal: Negative.   Skin: Negative.   Neurological: Negative.   Psychiatric/Behavioral: Negative.      Height 5\' 2"  (1.575 m), weight 62.1 kg, last menstrual period 07/07/2022. Physical Exam Constitutional:      Appearance: Normal appearance.  HENT:  Head: Normocephalic and atraumatic.  Cardiovascular:     Rate and Rhythm: Normal rate and regular rhythm.  Pulmonary:     Breath sounds: Normal breath sounds.  Abdominal:     General: Bowel sounds are normal.     Palpations: Abdomen is soft.  Genitourinary:    General: Normal vulva.     Rectum: Normal.  Musculoskeletal:        General: Normal range of motion.     Cervical back: Normal range of motion and neck supple.  Skin:    General: Skin is warm and dry.  Neurological:     General: No focal deficit present.     Mental Status: She is alert and oriented to person, place, and time.  Psychiatric:        Mood and Affect: Mood normal.        Behavior: Behavior normal.    [place] polyps Assessment/Plan: 51yo G0 with uterine polyps for hysteroscopy/D&C/possible myosure D/w pt r/b/a of surgery, also process and expectations Questions answered will proceed  Sallyann Kinnaird  Bovard-Stuckert 07/30/2022, 9:10 PM

## 2022-07-31 ENCOUNTER — Other Ambulatory Visit (HOSPITAL_COMMUNITY): Payer: Self-pay

## 2022-07-31 ENCOUNTER — Encounter (HOSPITAL_BASED_OUTPATIENT_CLINIC_OR_DEPARTMENT_OTHER): Payer: Self-pay | Admitting: Obstetrics and Gynecology

## 2022-07-31 ENCOUNTER — Encounter (HOSPITAL_BASED_OUTPATIENT_CLINIC_OR_DEPARTMENT_OTHER)
Admission: RE | Disposition: A | Discharge status: Home / Self Care | Payer: Self-pay | Source: Ambulatory Visit | Attending: Obstetrics and Gynecology

## 2022-07-31 ENCOUNTER — Ambulatory Visit (HOSPITAL_BASED_OUTPATIENT_CLINIC_OR_DEPARTMENT_OTHER): Payer: 59 | Admitting: Certified Registered"

## 2022-07-31 ENCOUNTER — Other Ambulatory Visit: Payer: Self-pay

## 2022-07-31 ENCOUNTER — Ambulatory Visit (HOSPITAL_BASED_OUTPATIENT_CLINIC_OR_DEPARTMENT_OTHER)
Admission: RE | Admit: 2022-07-31 | Discharge: 2022-07-31 | Disposition: A | Discharge status: Home / Self Care | Payer: 59 | Source: Ambulatory Visit | Attending: Obstetrics and Gynecology | Admitting: Obstetrics and Gynecology

## 2022-07-31 DIAGNOSIS — N840 Polyp of corpus uteri: Secondary | ICD-10-CM

## 2022-07-31 DIAGNOSIS — N72 Inflammatory disease of cervix uteri: Secondary | ICD-10-CM | POA: Diagnosis not present

## 2022-07-31 DIAGNOSIS — D759 Disease of blood and blood-forming organs, unspecified: Secondary | ICD-10-CM | POA: Insufficient documentation

## 2022-07-31 DIAGNOSIS — N879 Dysplasia of cervix uteri, unspecified: Secondary | ICD-10-CM | POA: Diagnosis not present

## 2022-07-31 DIAGNOSIS — E119 Type 2 diabetes mellitus without complications: Secondary | ICD-10-CM | POA: Diagnosis not present

## 2022-07-31 DIAGNOSIS — D649 Anemia, unspecified: Secondary | ICD-10-CM | POA: Insufficient documentation

## 2022-07-31 DIAGNOSIS — N841 Polyp of cervix uteri: Secondary | ICD-10-CM | POA: Diagnosis not present

## 2022-07-31 DIAGNOSIS — Z01818 Encounter for other preprocedural examination: Secondary | ICD-10-CM

## 2022-07-31 DIAGNOSIS — I10 Essential (primary) hypertension: Secondary | ICD-10-CM | POA: Insufficient documentation

## 2022-07-31 HISTORY — DX: Presence of spectacles and contact lenses: Z97.3

## 2022-07-31 HISTORY — DX: Iron deficiency anemia, unspecified: D50.9

## 2022-07-31 HISTORY — PX: DILATATION & CURETTAGE/HYSTEROSCOPY WITH MYOSURE: SHX6511

## 2022-07-31 HISTORY — DX: Polyp of corpus uteri: N84.0

## 2022-07-31 HISTORY — DX: Essential (primary) hypertension: I10

## 2022-07-31 HISTORY — DX: Type 2 diabetes mellitus without complications: E11.9

## 2022-07-31 HISTORY — DX: Other allergy status, other than to drugs and biological substances: Z91.09

## 2022-07-31 HISTORY — DX: Dizziness and giddiness: R42

## 2022-07-31 HISTORY — DX: Palpitations: R00.2

## 2022-07-31 LAB — GLUCOSE, CAPILLARY
Glucose-Capillary: 122 mg/dL — ABNORMAL HIGH (ref 70–99)
Glucose-Capillary: 107 mg/dL — ABNORMAL HIGH (ref 70–99)

## 2022-07-31 LAB — POCT PREGNANCY, URINE: Preg Test, Ur: NEGATIVE

## 2022-07-31 LAB — CBC
HCT: 37.9 % (ref 36.0–46.0)
Hemoglobin: 11.4 g/dL — ABNORMAL LOW (ref 12.0–15.0)
MCH: 23.6 pg — ABNORMAL LOW (ref 26.0–34.0)
MCHC: 30.1 g/dL (ref 30.0–36.0)
MCV: 78.5 fL — ABNORMAL LOW (ref 80.0–100.0)
Platelets: 429 10*3/uL — ABNORMAL HIGH (ref 150–400)
RBC: 4.83 MIL/uL (ref 3.87–5.11)
RDW: 21 % — ABNORMAL HIGH (ref 11.5–15.5)
WBC: 7.3 10*3/uL (ref 4.0–10.5)
nRBC: 0 % (ref 0.0–0.2)

## 2022-07-31 LAB — COMPREHENSIVE METABOLIC PANEL
ALT: 16 U/L (ref 0–44)
AST: 23 U/L (ref 15–41)
Albumin: 4.1 g/dL (ref 3.5–5.0)
Alkaline Phosphatase: 49 U/L (ref 38–126)
Anion gap: 9 (ref 5–15)
BUN: 14 mg/dL (ref 6–20)
CO2: 26 mmol/L (ref 22–32)
Calcium: 9.4 mg/dL (ref 8.9–10.3)
Chloride: 105 mmol/L (ref 98–111)
Creatinine, Ser: 0.56 mg/dL (ref 0.44–1.00)
GFR, Estimated: >60 mL/min (ref >60)
Glucose, Bld: 127 mg/dL — ABNORMAL HIGH (ref 70–99)
Potassium: 3.9 mmol/L (ref 3.5–5.1)
Sodium: 140 mmol/L (ref 135–145)
Total Bilirubin: 0.4 mg/dL (ref 0.3–1.2)
Total Protein: 7.1 g/dL (ref 6.5–8.1)

## 2022-07-31 SURGERY — DILATATION & CURETTAGE/HYSTEROSCOPY WITH MYOSURE
Anesthesia: General | Site: Vagina

## 2022-07-31 MED ORDER — OXYCODONE HCL 5 MG PO TABS: 5.0000 mg | ORAL_TABLET | Freq: Once | ORAL | Status: DC | PRN

## 2022-07-31 MED ORDER — ACETAMINOPHEN 500 MG PO TABS
ORAL_TABLET | ORAL | Status: AC
  Filled 2022-07-31: qty 1

## 2022-07-31 MED ORDER — MIDAZOLAM HCL 2 MG/2ML IJ SOLN
INTRAMUSCULAR | Status: DC | PRN
  Administered 2022-07-31: 2 mg via INTRAVENOUS

## 2022-07-31 MED ORDER — ACETAMINOPHEN 500 MG PO TABS
1000.0000 mg | ORAL_TABLET | ORAL | Status: AC
  Administered 2022-07-31: 1000 mg via ORAL

## 2022-07-31 MED ORDER — SODIUM CHLORIDE 0.9 % IR SOLN
Status: DC | PRN
  Administered 2022-07-31: 3000 mL

## 2022-07-31 MED ORDER — LIDOCAINE 2% (20 MG/ML) 5 ML SYRINGE
INTRAMUSCULAR | Status: DC | PRN
  Administered 2022-07-31: 60 mg via INTRAVENOUS

## 2022-07-31 MED ORDER — MIDAZOLAM HCL 2 MG/2ML IJ SOLN
INTRAMUSCULAR | Status: AC
  Filled 2022-07-31: qty 2

## 2022-07-31 MED ORDER — FENTANYL CITRATE (PF) 100 MCG/2ML IJ SOLN: 25.0000 ug | INTRAMUSCULAR | Status: DC | PRN

## 2022-07-31 MED ORDER — DEXAMETHASONE SODIUM PHOSPHATE 10 MG/ML IJ SOLN
INTRAMUSCULAR | Status: DC | PRN
  Administered 2022-07-31: 5 mg via INTRAVENOUS

## 2022-07-31 MED ORDER — ONDANSETRON HCL 4 MG/2ML IJ SOLN: 4.0000 mg | Freq: Once | INTRAMUSCULAR | Status: DC | PRN

## 2022-07-31 MED ORDER — PROPOFOL 10 MG/ML IV BOLUS
INTRAVENOUS | Status: AC
  Filled 2022-07-31: qty 20

## 2022-07-31 MED ORDER — FENTANYL CITRATE (PF) 100 MCG/2ML IJ SOLN
INTRAMUSCULAR | Status: DC | PRN
  Administered 2022-07-31: 25 ug via INTRAVENOUS
  Administered 2022-07-31: 50 ug via INTRAVENOUS

## 2022-07-31 MED ORDER — OXYCODONE HCL 5 MG/5ML PO SOLN: 5.0000 mg | Freq: Once | ORAL | Status: DC | PRN

## 2022-07-31 MED ORDER — POVIDONE-IODINE 10 % EX SWAB: 2.0000 | Freq: Once | CUTANEOUS | Status: DC

## 2022-07-31 MED ORDER — PHENYLEPHRINE 80 MCG/ML (10ML) SYRINGE FOR IV PUSH (FOR BLOOD PRESSURE SUPPORT)
PREFILLED_SYRINGE | INTRAVENOUS | Status: DC | PRN
  Administered 2022-07-31 (×2): 80 ug via INTRAVENOUS

## 2022-07-31 MED ORDER — ONDANSETRON HCL 4 MG/2ML IJ SOLN
INTRAMUSCULAR | Status: DC | PRN
  Administered 2022-07-31: 4 mg via INTRAVENOUS

## 2022-07-31 MED ORDER — ONDANSETRON HCL 4 MG/2ML IJ SOLN
INTRAMUSCULAR | Status: AC
  Filled 2022-07-31: qty 2

## 2022-07-31 MED ORDER — LACTATED RINGERS IV SOLN: INTRAVENOUS | Status: DC

## 2022-07-31 MED ORDER — PROPOFOL 10 MG/ML IV BOLUS
INTRAVENOUS | Status: DC | PRN
  Administered 2022-07-31: 160 mg via INTRAVENOUS

## 2022-07-31 MED ORDER — LIDOCAINE HCL 1 % IJ SOLN
INTRAMUSCULAR | Status: DC | PRN
  Administered 2022-07-31: 10 mL

## 2022-07-31 MED ORDER — IBUPROFEN 800 MG PO TABS
800.0000 mg | ORAL_TABLET | Freq: Three times a day (TID) | ORAL | 1 refills | Status: DC | PRN
  Filled 2022-07-31: qty 30, 10d supply, fill #0

## 2022-07-31 MED ORDER — KETOROLAC TROMETHAMINE 30 MG/ML IJ SOLN
INTRAMUSCULAR | Status: DC | PRN
  Administered 2022-07-31: 30 mg via INTRAVENOUS

## 2022-07-31 MED ORDER — DEXAMETHASONE SODIUM PHOSPHATE 10 MG/ML IJ SOLN
INTRAMUSCULAR | Status: AC
  Filled 2022-07-31: qty 1

## 2022-07-31 MED ORDER — FENTANYL CITRATE (PF) 100 MCG/2ML IJ SOLN
INTRAMUSCULAR | Status: AC
  Filled 2022-07-31: qty 2

## 2022-07-31 MED ORDER — KETOROLAC TROMETHAMINE 30 MG/ML IJ SOLN
INTRAMUSCULAR | Status: AC
  Filled 2022-07-31: qty 1

## 2022-07-31 MED ORDER — LIDOCAINE HCL (PF) 2 % IJ SOLN
INTRAMUSCULAR | Status: AC
  Filled 2022-07-31: qty 5

## 2022-07-31 SURGICAL SUPPLY — 17 items
DEVICE MYOSURE REACH (MISCELLANEOUS) IMPLANT
DILATOR CANAL MILEX (MISCELLANEOUS) IMPLANT
GAUZE 4X4 16PLY ~~LOC~~+RFID DBL (SPONGE) IMPLANT
GLOVE BIO SURGEON STRL SZ 6.5 (GLOVE) ×1 IMPLANT
GLOVE BIOGEL PI IND STRL 7.0 (GLOVE) IMPLANT
GLOVE SURG SS PI 7.5 STRL IVOR (GLOVE) IMPLANT
GOWN STRL REUS W/TWL LRG LVL3 (GOWN DISPOSABLE) ×1 IMPLANT
IV NS IRRIG 3000ML ARTHROMATIC (IV SOLUTION) ×1 IMPLANT
KIT PROCEDURE FLUENT (KITS) ×1 IMPLANT
KIT TURNOVER CYSTO (KITS) ×1 IMPLANT
PACK VAGINAL MINOR WOMEN LF (CUSTOM PROCEDURE TRAY) ×1 IMPLANT
PAD OB MATERNITY 4.3X12.25 (PERSONAL CARE ITEMS) ×1 IMPLANT
SEAL ROD LENS SCOPE MYOSURE (ABLATOR) ×1 IMPLANT
SOL PREP POV-IOD 4OZ 10% (MISCELLANEOUS) IMPLANT
SPIKE FLUID TRANSFER (MISCELLANEOUS) IMPLANT
SUT VIC AB 2-0 SH 27 (SUTURE) ×1
SUT VIC AB 2-0 SH 27XBRD (SUTURE) IMPLANT

## 2022-07-31 NOTE — Anesthesia Procedure Notes (Signed)
Procedure Name: LMA Insertion Date/Time: 07/31/2022 9:11 AM  Performed by: Francie Massing, CRNAPre-anesthesia Checklist: Patient identified, Emergency Drugs available, Suction available and Patient being monitored Patient Re-evaluated:Patient Re-evaluated prior to induction Oxygen Delivery Method: Circle system utilized Preoxygenation: Pre-oxygenation with 100% oxygen Induction Type: IV induction Ventilation: Mask ventilation without difficulty LMA: LMA inserted LMA Size: 4.0 Number of attempts: 1 Airway Equipment and Method: Bite block Placement Confirmation: positive ETCO2 Tube secured with: Tape Dental Injury: Teeth and Oropharynx as per pre-operative assessment

## 2022-07-31 NOTE — Transfer of Care (Signed)
Immediate Anesthesia Transfer of Care Note  Patient: Kaitlin Bowman  Procedure(s) Performed: Procedure(s) (LRB): DILATATION & CURETTAGE/HYSTEROSCOPY WITH MYOSURE (N/A)  Patient Location: PACU  Anesthesia Type: General  Level of Consciousness: awake, oriented, sedated and patient cooperative  Airway & Oxygen Therapy: Patient Spontanous Breathing and Patient connected to face mask oxygen  Post-op Assessment: Report given to PACU RN and Post -op Vital signs reviewed and stable  Post vital signs: Reviewed and stable  Complications: No apparent anesthesia complications Last Vitals:  Vitals Value Taken Time  BP 145/90 07/31/22 1000  Temp    Pulse 72 07/31/22 1005  Resp 13 07/31/22 1005  SpO2 100 % 07/31/22 1005  Vitals shown include unvalidated device data.  Last Pain:  Vitals:   07/31/22 0806  TempSrc: Oral         Complications: No notable events documented.

## 2022-07-31 NOTE — Brief Op Note (Signed)
07/31/2022  10:05 AM  PATIENT:  Kaitlin Bowman  51 y.o. female  PRE-OPERATIVE DIAGNOSIS:  polyp  POST-OPERATIVE DIAGNOSIS:  polyp  PROCEDURE:  Procedure(s): DILATATION & CURETTAGE/HYSTEROSCOPY WITH MYOSURE (N/A)  SURGEON:  Surgeon(s) and Role:    * Bovard-Stuckert, Luz Mares, MD - Primary  ANESTHESIA:   general by LMA, paracervical block  EBL:  20 mL IVF and uop per anesthesia  DRAINS: none   LOCAL MEDICATIONS USED:  LIDOCAINE   SPECIMEN:  Source of Specimen:  polyp and Endometrial curettage  DISPOSITION OF SPECIMEN:  PATHOLOGY  COUNTS:  YES  TOURNIQUET:  * No tourniquets in log *  DICTATION: .Other Dictation: Dictation Number [id number]  PLAN OF CARE: Discharge to home after PACU  PATIENT DISPOSITION:  PACU - hemodynamically stable.   Delay start of Pharmacological VTE agent (>24hrs) due to surgical blood loss or risk of bleeding: not applicable

## 2022-07-31 NOTE — Anesthesia Preprocedure Evaluation (Signed)
Anesthesia Evaluation  Patient identified by MRN, date of birth, ID band Patient awake    Reviewed: Allergy & Precautions, NPO status , Patient's Chart, lab work & pertinent test results, reviewed documented beta blocker date and time   Airway Mallampati: II       Dental no notable dental hx. (+) Teeth Intact, Dental Advisory Given   Pulmonary neg pulmonary ROS   Pulmonary exam normal breath sounds clear to auscultation       Cardiovascular hypertension, Pt. on medications Normal cardiovascular exam Rhythm:Regular Rate:Normal     Neuro/Psych negative neurological ROS  negative psych ROS   GI/Hepatic negative GI ROS, Neg liver ROS,,,  Endo/Other  diabetes, Well Controlled    Renal/GU negative Renal ROS  negative genitourinary   Musculoskeletal   Abdominal   Peds  Hematology  (+) Blood dyscrasia, anemia   Anesthesia Other Findings   Reproductive/Obstetrics Endometrial polyp                             Anesthesia Physical Anesthesia Plan  ASA: 2  Anesthesia Plan: General   Post-op Pain Management: Minimal or no pain anticipated   Induction: Intravenous  PONV Risk Score and Plan: 4 or greater and Treatment may vary due to age or medical condition, Midazolam, Ondansetron and Dexamethasone  Airway Management Planned: LMA  Additional Equipment: None  Intra-op Plan:   Post-operative Plan: Extubation in OR  Informed Consent: I have reviewed the patients History and Physical, chart, labs and discussed the procedure including the risks, benefits and alternatives for the proposed anesthesia with the patient or authorized representative who has indicated his/her understanding and acceptance.     Dental advisory given  Plan Discussed with: Anesthesiologist and CRNA  Anesthesia Plan Comments:        Anesthesia Quick Evaluation

## 2022-07-31 NOTE — Interval H&P Note (Signed)
History and Physical Interval Note:  07/31/2022 8:49 AM  Kaitlin Bowman  has presented today for surgery, with the diagnosis of polyp.  The various methods of treatment have been discussed with the patient and family. After consideration of risks, benefits and other options for treatment, the patient has consented to  Procedure(s): DILATATION & CURETTAGE/HYSTEROSCOPY WITH MYOSURE (N/A) as a surgical intervention.  The patient's history has been reviewed, patient examined, no change in status, stable for surgery.  I have reviewed the patient's chart and labs.  Questions were answered to the patient's satisfaction.     Clydean Posas Bovard-Stuckert

## 2022-07-31 NOTE — Op Note (Signed)
NAMEJACQUELYNNE, Kaitlin Bowman MEDICAL RECORD NO: [id number] ACCOUNT NO: [id number] DATE OF BIRTH: 09-23-1970 FACILITY: [facility] LOCATION: [facility] PHYSICIAN: Sherian Rein, MD  Operative Report   DATE OF PROCEDURE: 07/31/2022  PREOPERATIVE DIAGNOSIS:  Uterine polyp.  POSTOPERATIVE DIAGNOSES:  Multiple uterine polyps as well as endocervical polyps.  PROCEDURE:  Hysteroscopy with D and C and MyoSure.  SURGEON:  Sherian Rein, MD  ANESTHESIA:  General by LMA and paracervical block.  ESTIMATED BLOOD LOSS: 20 mL  IV FLUIDS AND URINE OUTPUT:  Per anesthesia.  COMPLICATIONS:  None.  PATHOLOGY: Polyp and endometrial curettage to pathology.  DESCRIPTION OF PROCEDURE:  After informed consent was reviewed with the patient including risks, benefits and alternatives of the surgical procedure, she was transported to the operating room, placed on the table in supine position.  General anesthesia  was induced and found to be adequate.  She was then placed in the [place] stirrups, prepped and draped in normal sterile fashion.  After an appropriate timeout was performed, an open-sided speculum was used to visualize her cervix.  Cervix was easily  visualized, grasped.  The paracervical block was placed with 10 mL of 1% lidocaine. Anterior lip of the cervix was grasped with a single tooth tenaculum.  The cervix was dilated to 20 [place] to accommodate the hysteroscope. The polyp was noted at the  endocervical os. This was removed.  The uterus was evaluated.  A large polyp was noted as well as thickened endometrial stripe.  A D and C as well as polypectomy by MyoSure was performed.  The patient tolerated this well.  Hemostasis was assured.  On  removal of the speculum, it was noted she had a minimal laceration in her posterior introitus.  This was repaired with 3-0 Vicryl figure-of-eight suture.  The patient tolerated the procedure well.  Sponge, lap and needle count was correct x2 per the  operating  staff.   SHW D: 07/31/2022 10:10:00 am T: 07/31/2022 10:29:00 am  JOB: [id number]/ [id number]

## 2022-07-31 NOTE — Anesthesia Postprocedure Evaluation (Signed)
Anesthesia Post Note  Patient: Kaitlin Bowman  Procedure(s) Performed: DILATATION & CURETTAGE/HYSTEROSCOPY WITH MYOSURE (Vagina )     Patient location during evaluation: PACU Anesthesia Type: General Level of consciousness: awake and alert and oriented Pain management: pain level controlled Vital Signs Assessment: post-procedure vital signs reviewed and stable Respiratory status: spontaneous breathing, nonlabored ventilation and respiratory function stable Cardiovascular status: blood pressure returned to baseline and stable Postop Assessment: no apparent nausea or vomiting Anesthetic complications: no   No notable events documented.  Last Vitals:  Vitals:   07/31/22 1032 07/31/22 1110  BP: 128/86 127/75  Pulse: 70 70  Resp: 20 17  Temp:    SpO2: 98% 100%    Last Pain:  Vitals:   07/31/22 1032  TempSrc:   PainSc: 2                  Dovber Ernest A.

## 2022-07-31 NOTE — Discharge Instructions (Addendum)
    DISCHARGE INSTRUCTIONS: HYSTEROSCOPY / ENDOMETRIAL ABLATION The following instructions have been prepared to help you care for yourself upon your return home.  May Remove Scop patch on or before  May take Ibuprofen after  May take stool softner while taking narcotic pain medication to prevent constipation.  Drink plenty of water.  Personal hygiene:  Use sanitary pads for vaginal drainage, not tampons.  Shower the day after your procedure.  NO tub baths, pools or Jacuzzis for 2-3 weeks.  Wipe front to back after using the bathroom.  Activity and limitations:  Do NOT drive or operate any equipment for 24 hours. The effects of anesthesia are still present and drowsiness may result.  Do NOT rest in bed all day.  Walking is encouraged.  Walk up and down stairs slowly.  You may resume your normal activity in one to two days or as indicated by your physician. Sexual activity: NO intercourse for at least 2 weeks after the procedure, or as indicated by your Doctor.  Diet: Eat a light meal as desired this evening. You may resume your usual diet tomorrow.  Return to Work: You may resume your work activities in one to two days or as indicated by Therapist, sports.  What to expect after your surgery: Expect to have vaginal bleeding/discharge for 2-3 days and spotting for up to 10 days. It is not unusual to have soreness for up to 1-2 weeks. You may have a slight burning sensation when you urinate for the first day. Mild cramps may continue for a couple of days. You may have a regular period in 2-6 weeks.  Call your doctor for any of the following:  Excessive vaginal bleeding or clotting, saturating and changing one pad every hour.  Inability to urinate 6 hours after discharge from hospital.  Pain not relieved by pain medication.  Fever of 100.4 F or greater.  Unusual vaginal discharge or odor.     Post Anesthesia Home Care Instructions  Activity: Get plenty of rest for the  remainder of the day. A responsible individual must stay with you for 24 hours following the procedure.  For the next 24 hours, DO NOT: -Drive a car -Advertising copywriter -Drink alcoholic beverages -Take any medication unless instructed by your physician -Make any legal decisions or sign important papers.  Meals: Start with liquid foods such as gelatin or soup. Progress to regular foods as tolerated. Avoid greasy, spicy, heavy foods. If nausea and/or vomiting occur, drink only clear liquids until the nausea and/or vomiting subsides. Call your physician if vomiting continues.  Special Instructions/Symptoms: Your throat may feel dry or sore from the anesthesia or the breathing tube placed in your throat during surgery. If this causes discomfort, gargle with warm salt water. The discomfort should disappear within 24 hours.  Do not take any Tylenol until after 2:30 pm today. (Tylenol 1,000 mg given at 8:30 am this morning).

## 2022-08-01 LAB — SURGICAL PATHOLOGY

## 2022-08-04 ENCOUNTER — Encounter (HOSPITAL_BASED_OUTPATIENT_CLINIC_OR_DEPARTMENT_OTHER): Payer: Self-pay | Admitting: Obstetrics and Gynecology

## 2022-08-14 ENCOUNTER — Ambulatory Visit (INDEPENDENT_AMBULATORY_CARE_PROVIDER_SITE_OTHER): Payer: 59 | Admitting: Physician Assistant

## 2022-08-14 VITALS — BP 146/80 | HR 78 | Ht 62.0 in | Wt 137.0 lb

## 2022-08-14 DIAGNOSIS — I10 Essential (primary) hypertension: Secondary | ICD-10-CM

## 2022-08-14 DIAGNOSIS — E785 Hyperlipidemia, unspecified: Secondary | ICD-10-CM

## 2022-08-14 DIAGNOSIS — E119 Type 2 diabetes mellitus without complications: Secondary | ICD-10-CM | POA: Diagnosis not present

## 2022-08-14 NOTE — Progress Notes (Signed)
Established Patient Office Visit  Subjective   Patient ID: Kaitlin Bowman, female    DOB: Feb 04, 1971  Age: 51 y.o. MRN: [id number]  Chief Complaint  Patient presents with   Follow-up    HPI Pt is a 51 yo female with T2DM, HTN, HLD who presents to the clinic for 3 month follow up.   Pt is doing well on metformin only. She checks her sugars and under 130 in mornings. No open sores or wounds. No CP, palpitations, headaches or vision changes. No hypoglycemic events.   She is taking her oral iron for anemia. No concerns.   .. 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   Vitamin D deficiency 12/11/2017   Graves disease 12/11/2017   Vertigo 12/11/2017   Irregular periods 01/26/2018   Anemia 01/11/2019   Microalbuminuria 01/14/2019   Hyperlipidemia LDL goal <70 01/14/2019   Palpitations 03/12/2020   Lumbar facet arthropathy 10/01/2020   Calcification of tendon 10/02/2020   Uterine polyp 07/31/2022   Resolved Ambulatory Problems    Diagnosis Date Noted   Well woman exam without gynecological exam 01/26/2018   Essential hypertension 01/14/2019   Past Medical History:  Diagnosis Date   Endometrial polyp    Environmental allergies    Goiter with hyperthyroidism 2009   History of positive PPD 2015   Hyperlipidemia    Hypertension    IDA (iron deficiency anemia)    Intermittent palpitations    Menorrhagia    Type 2 diabetes mellitus (HCC)    Wears glasses      Review of Systems  All other systems reviewed and are negative.     Objective:     BP (!) 158/78   Pulse 78   Ht 5\' 2"  (1.575 m)   Wt 137 lb (62.1 kg)   LMP 07/07/2022 (Approximate)   SpO2 100%   BMI 25.06 kg/m  BP Readings from Last 3 Encounters:  08/14/22 (!) 146/80  07/31/22 127/75   06/17/22 112/76   Wt Readings from Last 3 Encounters:  08/14/22 137 lb (62.1 kg)  07/31/22 138 lb 11.2 oz (62.9 kg)  06/17/22 136 lb 9.6 oz (62 kg)    ..    04/22/2022   10:55 AM 04/18/2021   11:19 AM 10/01/2020   10:08 AM 03/09/2020   11:02 AM 01/14/2019   11:32 AM  Depression screen PHQ 2/9  Decreased Interest 1 1 1 1 1   Down, Depressed, Hopeless 1 1 1 1 1   PHQ - 2 Score 2 2 2 2 2   Altered sleeping 1 1 1 1 1   Tired, decreased energy 1 2 1 1 1   Change in appetite 1 1 1  0 0  Feeling bad or failure about yourself  1 0 1 1 1   Trouble concentrating 0 0 1 0 0  Moving slowly or fidgety/restless 0 0 0 0 0  Suicidal thoughts 0 0 0 0 0  PHQ-9 Score 6 6 7 5 5   Difficult doing work/chores Somewhat difficult Not difficult at all Not difficult at all Not difficult at all Not difficult at all    Physical Exam Constitutional:      Appearance: Normal appearance.  HENT:     Head: Normocephalic.  Cardiovascular:     Rate and Rhythm: Normal rate and regular  rhythm.     Pulses: Normal pulses.     Heart sounds: Normal heart sounds.  Pulmonary:     Effort: Pulmonary effort is normal.  Neurological:     Mental Status: She is alert.  Psychiatric:        Mood and Affect: Mood normal.    .. Lab Results  Component Value Date   HGBA1C 6.9 (H) 07/24/2022      Assessment & Plan:  Marland KitchenMarland KitchenWestley Gambles was seen today for follow-up.  Diagnoses and all orders for this visit:  Type 2 diabetes mellitus without complication, without long-term current use of insulin (HCC)  Essential hypertension  Hyperlipidemia LDL goal <70   Reviewed labs A1C under 7 Continue same medications On statin BP not to goal  Continue to monitor at home Eye and foot exam UTD Flu and covid vaccine UTD Follow up in 3 months   Tandy Gaw, PA-C

## 2022-08-19 ENCOUNTER — Encounter: Payer: Self-pay | Admitting: Physician Assistant

## 2022-08-22 ENCOUNTER — Other Ambulatory Visit: Payer: Self-pay

## 2022-08-22 ENCOUNTER — Other Ambulatory Visit (HOSPITAL_COMMUNITY): Payer: Self-pay

## 2022-09-02 ENCOUNTER — Encounter: Payer: Self-pay | Admitting: Physician Assistant

## 2022-09-02 DIAGNOSIS — Z8639 Personal history of other endocrine, nutritional and metabolic disease: Secondary | ICD-10-CM

## 2022-11-17 ENCOUNTER — Ambulatory Visit: Payer: Commercial Managed Care - PPO | Admitting: Physician Assistant

## 2022-12-02 ENCOUNTER — Other Ambulatory Visit: Payer: Self-pay | Admitting: Physician Assistant

## 2022-12-02 ENCOUNTER — Other Ambulatory Visit: Payer: Self-pay

## 2022-12-03 ENCOUNTER — Other Ambulatory Visit (HOSPITAL_COMMUNITY): Payer: Self-pay

## 2022-12-03 MED ORDER — METFORMIN HCL ER 500 MG PO TB24
500.0000 mg | ORAL_TABLET | Freq: Two times a day (BID) | ORAL | 0 refills | Status: DC
  Filled 2022-12-03: qty 60, 30d supply, fill #0

## 2022-12-04 ENCOUNTER — Other Ambulatory Visit (HOSPITAL_COMMUNITY): Payer: Self-pay

## 2022-12-10 ENCOUNTER — Ambulatory Visit (INDEPENDENT_AMBULATORY_CARE_PROVIDER_SITE_OTHER): Payer: 59 | Admitting: Physician Assistant

## 2022-12-10 ENCOUNTER — Other Ambulatory Visit (HOSPITAL_COMMUNITY): Payer: Self-pay

## 2022-12-10 ENCOUNTER — Encounter: Payer: Self-pay | Admitting: Physician Assistant

## 2022-12-10 VITALS — BP 128/72 | HR 80 | Ht 62.0 in

## 2022-12-10 DIAGNOSIS — Z7984 Long term (current) use of oral hypoglycemic drugs: Secondary | ICD-10-CM

## 2022-12-10 DIAGNOSIS — R1310 Dysphagia, unspecified: Secondary | ICD-10-CM | POA: Diagnosis not present

## 2022-12-10 DIAGNOSIS — R070 Pain in throat: Secondary | ICD-10-CM | POA: Diagnosis not present

## 2022-12-10 DIAGNOSIS — J392 Other diseases of pharynx: Secondary | ICD-10-CM

## 2022-12-10 DIAGNOSIS — E119 Type 2 diabetes mellitus without complications: Secondary | ICD-10-CM | POA: Diagnosis not present

## 2022-12-10 LAB — POCT GLYCOSYLATED HEMOGLOBIN (HGB A1C): Hemoglobin A1C: 6.5 % — AB (ref 4.0–5.6)

## 2022-12-10 MED ORDER — METFORMIN HCL ER 500 MG PO TB24
500.0000 mg | ORAL_TABLET | Freq: Two times a day (BID) | ORAL | 1 refills | Status: DC
  Filled 2022-12-10 – 2022-12-31 (×2): qty 180, 90d supply, fill #0
  Filled 2023-03-22: qty 180, 90d supply, fill #1

## 2022-12-10 NOTE — Progress Notes (Unsigned)
   Established Patient Office Visit  Subjective   Patient ID: Kaitlin Bowman, female    DOB: 1970-11-03  Age: 52 y.o. MRN: [id number]  Chief Complaint  Patient presents with   Diabetes    HPI Scabb left ear scabb and harder to get in  No pain   Left eat  Th  {History (Optional):23778}  ROS    Objective:     BP (!) 146/84   Pulse 83   Ht  (1.575 m)   SpO2 100%   BMI 25.06 kg/m  BP Readings from Last 3 Encounters:  12/10/22 (!) 146/84  08/14/22 (!) 146/80  07/31/22 127/75   Wt Readings from Last 3 Encounters:  08/14/22 137 lb (62.1 kg)  07/31/22 138 lb 11.2 oz (62.9 kg)  06/17/22 136 lb 9.6 oz (62 kg)      Physical Exam   Results for orders placed or performed in visit on 12/10/22  POCT HgB A1C  Result Value Ref Range   Hemoglobin A1C 6.5 (A) 4.0 - 5.6 %   HbA1c POC (<> result, manual entry)     HbA1c, POC (prediabetic range)     HbA1c, POC (controlled diabetic range)        The 10-year ASCVD risk score (Arnett DK, et al., 2019) is: 2.5%    Assessment & Plan:     No follow-ups on file.    Tandy Gaw, PA-C

## 2022-12-11 ENCOUNTER — Other Ambulatory Visit: Payer: Self-pay | Admitting: Physician Assistant

## 2022-12-11 ENCOUNTER — Ambulatory Visit (HOSPITAL_BASED_OUTPATIENT_CLINIC_OR_DEPARTMENT_OTHER)
Admission: RE | Admit: 2022-12-11 | Discharge: 2022-12-11 | Disposition: A | Discharge status: Home / Self Care | Payer: 59 | Source: Ambulatory Visit | Attending: Physician Assistant | Admitting: Physician Assistant

## 2022-12-11 ENCOUNTER — Ambulatory Visit (HOSPITAL_BASED_OUTPATIENT_CLINIC_OR_DEPARTMENT_OTHER): Payer: 59

## 2022-12-11 ENCOUNTER — Encounter: Payer: Self-pay | Admitting: Physician Assistant

## 2022-12-11 ENCOUNTER — Encounter (HOSPITAL_BASED_OUTPATIENT_CLINIC_OR_DEPARTMENT_OTHER): Payer: Self-pay

## 2022-12-11 DIAGNOSIS — R1310 Dysphagia, unspecified: Secondary | ICD-10-CM | POA: Insufficient documentation

## 2022-12-11 DIAGNOSIS — R070 Pain in throat: Secondary | ICD-10-CM | POA: Diagnosis not present

## 2022-12-11 DIAGNOSIS — E049 Nontoxic goiter, unspecified: Secondary | ICD-10-CM | POA: Diagnosis not present

## 2022-12-11 DIAGNOSIS — E119 Type 2 diabetes mellitus without complications: Secondary | ICD-10-CM

## 2022-12-11 DIAGNOSIS — Z8639 Personal history of other endocrine, nutritional and metabolic disease: Secondary | ICD-10-CM

## 2022-12-12 NOTE — Progress Notes (Signed)
Thyroid is normal. I think seeing ENT for consult would be a good idea. Are you ok with this?

## 2022-12-15 ENCOUNTER — Encounter: Payer: Self-pay | Admitting: Physician Assistant

## 2022-12-15 DIAGNOSIS — R1310 Dysphagia, unspecified: Secondary | ICD-10-CM

## 2022-12-15 DIAGNOSIS — Z8639 Personal history of other endocrine, nutritional and metabolic disease: Secondary | ICD-10-CM

## 2022-12-15 DIAGNOSIS — R070 Pain in throat: Secondary | ICD-10-CM

## 2022-12-15 NOTE — Telephone Encounter (Signed)
Can We can call imaging to verify that they did not see any abnormalities with the soft tissue.  I can see where her original order was for thyroid and soft tissue but I did not see any comment for guards to the soft tissue.  They may have combined imaging.  I did go ahead and place ENT referral.   Orders Placed This Encounter  Procedures   Ambulatory referral to ENT    Referral Priority:   Routine    Referral Type:   Consultation    Referral Reason:   Specialty Services Required    Requested Specialty:   Otolaryngology    Number of Visits Requested:   1

## 2022-12-16 ENCOUNTER — Other Ambulatory Visit: Payer: Self-pay

## 2022-12-16 NOTE — Telephone Encounter (Signed)
Awesome.  I did do another result note on the imaging report directly.

## 2022-12-16 NOTE — Telephone Encounter (Signed)
I called [facility] and they will have another look at the ultrasound.

## 2022-12-16 NOTE — Progress Notes (Signed)
Hi Hye,  We did have the radiologist go back and look specifically at the area around the thyroid gland.  Please see the addendum below.  So everything looks reassuring they did not see any mass or abnormal lymph node or discrete solid or cystic lesions.  This is very reassuring.

## 2022-12-17 ENCOUNTER — Other Ambulatory Visit: Payer: Self-pay | Admitting: Physician Assistant

## 2022-12-17 DIAGNOSIS — R1310 Dysphagia, unspecified: Secondary | ICD-10-CM

## 2022-12-31 ENCOUNTER — Other Ambulatory Visit: Payer: Self-pay | Admitting: Physician Assistant

## 2022-12-31 ENCOUNTER — Other Ambulatory Visit (HOSPITAL_COMMUNITY): Payer: Self-pay

## 2022-12-31 ENCOUNTER — Other Ambulatory Visit: Payer: Self-pay

## 2022-12-31 MED ORDER — MECLIZINE HCL 25 MG PO TABS
25.0000 mg | ORAL_TABLET | Freq: Three times a day (TID) | ORAL | 0 refills | Status: DC | PRN
  Filled 2022-12-31: qty 30, 10d supply, fill #0

## 2023-01-16 DIAGNOSIS — H6983 Other specified disorders of Eustachian tube, bilateral: Secondary | ICD-10-CM | POA: Diagnosis not present

## 2023-01-16 DIAGNOSIS — R1313 Dysphagia, pharyngeal phase: Secondary | ICD-10-CM | POA: Diagnosis not present

## 2023-01-28 ENCOUNTER — Other Ambulatory Visit: Payer: Self-pay | Admitting: Physician Assistant

## 2023-01-28 DIAGNOSIS — Z1231 Encounter for screening mammogram for malignant neoplasm of breast: Secondary | ICD-10-CM

## 2023-03-23 ENCOUNTER — Other Ambulatory Visit (HOSPITAL_COMMUNITY): Payer: Self-pay

## 2023-03-24 ENCOUNTER — Ambulatory Visit: Admission: RE | Admit: 2023-03-24 | Payer: 59 | Source: Ambulatory Visit

## 2023-03-24 DIAGNOSIS — Z1231 Encounter for screening mammogram for malignant neoplasm of breast: Secondary | ICD-10-CM | POA: Diagnosis not present

## 2023-03-25 NOTE — Progress Notes (Signed)
Normal mammogram. Follow up in 1 year.

## 2023-04-16 ENCOUNTER — Other Ambulatory Visit (HOSPITAL_COMMUNITY): Payer: Self-pay

## 2023-04-16 DIAGNOSIS — H5213 Myopia, bilateral: Secondary | ICD-10-CM | POA: Diagnosis not present

## 2023-04-16 DIAGNOSIS — H524 Presbyopia: Secondary | ICD-10-CM | POA: Diagnosis not present

## 2023-04-16 DIAGNOSIS — H52222 Regular astigmatism, left eye: Secondary | ICD-10-CM | POA: Diagnosis not present

## 2023-04-16 DIAGNOSIS — E119 Type 2 diabetes mellitus without complications: Secondary | ICD-10-CM | POA: Diagnosis not present

## 2023-04-16 DIAGNOSIS — Z7984 Long term (current) use of oral hypoglycemic drugs: Secondary | ICD-10-CM | POA: Diagnosis not present

## 2023-04-16 MED ORDER — REFRESH OPTIVE MEGA-3 0.5-1-0.5 % OP SOLN
OPHTHALMIC | 4 refills | Status: AC
  Filled 2023-04-17: qty 60, 30d supply, fill #0
  Filled 2023-09-17 – 2023-09-21 (×2): qty 60, 30d supply, fill #1

## 2023-04-16 MED ORDER — REFRESH P.M. OP OINT
TOPICAL_OINTMENT | OPHTHALMIC | 4 refills | Status: AC
  Filled 2023-04-17: qty 3.5, 7d supply, fill #0
  Filled 2023-06-16: qty 3.5, 7d supply, fill #1
  Filled 2023-09-22 – 2023-09-24 (×2): qty 3.5, 7d supply, fill #2

## 2023-04-17 ENCOUNTER — Other Ambulatory Visit (HOSPITAL_COMMUNITY): Payer: Self-pay

## 2023-04-21 ENCOUNTER — Other Ambulatory Visit (HOSPITAL_COMMUNITY): Payer: Self-pay

## 2023-04-22 ENCOUNTER — Other Ambulatory Visit (HOSPITAL_COMMUNITY): Payer: Self-pay

## 2023-05-04 ENCOUNTER — Other Ambulatory Visit: Payer: Self-pay | Admitting: Physician Assistant

## 2023-05-06 ENCOUNTER — Other Ambulatory Visit (HOSPITAL_COMMUNITY): Payer: Self-pay

## 2023-05-13 ENCOUNTER — Other Ambulatory Visit (HOSPITAL_COMMUNITY): Payer: Self-pay

## 2023-05-13 MED ORDER — FREESTYLE LITE TEST VI STRP
ORAL_STRIP | Freq: Three times a day (TID) | 3 refills | Status: AC
  Filled 2023-05-13: qty 100, 33d supply, fill #0

## 2023-05-13 MED ORDER — FREESTYLE LANCETS MISC
Freq: Three times a day (TID) | 3 refills | Status: AC
  Filled 2023-05-13: qty 100, 33d supply, fill #0

## 2023-05-25 ENCOUNTER — Other Ambulatory Visit (HOSPITAL_COMMUNITY): Payer: Self-pay

## 2023-06-04 ENCOUNTER — Other Ambulatory Visit (HOSPITAL_BASED_OUTPATIENT_CLINIC_OR_DEPARTMENT_OTHER): Payer: Self-pay

## 2023-06-09 ENCOUNTER — Telehealth: Payer: Self-pay | Admitting: Physician Assistant

## 2023-06-09 NOTE — Telephone Encounter (Signed)
Patient called in wanting her lipid tsh and cmp lab work order put in before her physical

## 2023-06-12 ENCOUNTER — Telehealth: Payer: Self-pay | Admitting: Physician Assistant

## 2023-06-12 ENCOUNTER — Other Ambulatory Visit: Payer: Self-pay | Admitting: Physician Assistant

## 2023-06-12 DIAGNOSIS — Z8639 Personal history of other endocrine, nutritional and metabolic disease: Secondary | ICD-10-CM

## 2023-06-12 DIAGNOSIS — E785 Hyperlipidemia, unspecified: Secondary | ICD-10-CM | POA: Diagnosis not present

## 2023-06-12 DIAGNOSIS — E119 Type 2 diabetes mellitus without complications: Secondary | ICD-10-CM

## 2023-06-12 NOTE — Telephone Encounter (Signed)
Which lab would she like them to be ordered through [facility]?

## 2023-06-12 NOTE — Telephone Encounter (Signed)
Patient sent message on mychart she is requesting labs be ordered prior to her appointment on Tuesday October 29th

## 2023-06-12 NOTE — Telephone Encounter (Signed)
Pend labs

## 2023-06-13 LAB — CBC WITH DIFFERENTIAL/PLATELET
Basophils Absolute: 0.1 10*3/uL (ref 0.0–0.2)
Basos: 2 %
EOS (ABSOLUTE): 0.1 10*3/uL (ref 0.0–0.4)
Eos: 2 %
Hematocrit: 38.4 % (ref 34.0–46.6)
Hemoglobin: 11.5 g/dL (ref 11.1–15.9)
Immature Grans (Abs): 0 10*3/uL (ref 0.0–0.1)
Immature Granulocytes: 0 %
Lymphocytes Absolute: 2.3 10*3/uL (ref 0.7–3.1)
Lymphs: 38 %
MCH: 24.7 pg — ABNORMAL LOW (ref 26.6–33.0)
MCHC: 29.9 g/dL — ABNORMAL LOW (ref 31.5–35.7)
MCV: 82 fL (ref 79–97)
Monocytes Absolute: 0.3 10*3/uL (ref 0.1–0.9)
Monocytes: 5 %
Neutrophils Absolute: 3.2 10*3/uL (ref 1.4–7.0)
Neutrophils: 53 %
Platelets: 489 10*3/uL — ABNORMAL HIGH (ref 150–450)
RBC: 4.66 x10E6/uL (ref 3.77–5.28)
RDW: 15.3 % (ref 11.7–15.4)
WBC: 5.9 10*3/uL (ref 3.4–10.8)

## 2023-06-13 LAB — CMP14+EGFR
ALT: 13 [IU]/L (ref 0–32)
AST: 18 [IU]/L (ref 0–40)
Albumin: 4.7 g/dL (ref 3.8–4.9)
Alkaline Phosphatase: 69 [IU]/L (ref 44–121)
BUN/Creatinine Ratio: 23 (ref 9–23)
BUN: 14 mg/dL (ref 6–24)
Bilirubin Total: 0.3 mg/dL (ref 0.0–1.2)
CO2: 26 mmol/L (ref 20–29)
Calcium: 9.2 mg/dL (ref 8.7–10.2)
Chloride: 100 mmol/L (ref 96–106)
Creatinine, Ser: 0.6 mg/dL (ref 0.57–1.00)
Globulin, Total: 3.1 g/dL (ref 1.5–4.5)
Glucose: 101 mg/dL — ABNORMAL HIGH (ref 70–99)
Potassium: 3.8 mmol/L (ref 3.5–5.2)
Sodium: 138 mmol/L (ref 134–144)
Total Protein: 7.8 g/dL (ref 6.0–8.5)
eGFR: 107 mL/min/{1.73_m2} (ref >59)

## 2023-06-13 LAB — MICROALBUMIN / CREATININE URINE RATIO
Creatinine, Urine: 108.8 mg/dL
Microalb/Creat Ratio: 9 mg/g{creat} (ref 0–29)
Microalbumin, Urine: 9.3 ug/mL

## 2023-06-13 LAB — LIPID PANEL
Chol/HDL Ratio: 2.4 ratio (ref 0.0–4.4)
Cholesterol, Total: 198 mg/dL (ref 100–199)
HDL: 82 mg/dL (ref >39)
LDL Chol Calc (NIH): 104 mg/dL — ABNORMAL HIGH (ref 0–99)
Triglycerides: 64 mg/dL (ref 0–149)
VLDL Cholesterol Cal: 12 mg/dL (ref 5–40)

## 2023-06-13 LAB — HEMOGLOBIN A1C
Est. average glucose Bld gHb Est-mCnc: 148 mg/dL
Hgb A1c MFr Bld: 6.8 % — ABNORMAL HIGH (ref 4.8–5.6)

## 2023-06-13 LAB — TSH: TSH: 3.88 u[IU]/mL (ref 0.450–4.500)

## 2023-06-15 NOTE — Progress Notes (Signed)
Will discuss at visit tomorrow

## 2023-06-16 ENCOUNTER — Ambulatory Visit (INDEPENDENT_AMBULATORY_CARE_PROVIDER_SITE_OTHER): Payer: 59 | Admitting: Physician Assistant

## 2023-06-16 ENCOUNTER — Other Ambulatory Visit: Payer: Self-pay

## 2023-06-16 ENCOUNTER — Encounter: Payer: Self-pay | Admitting: Physician Assistant

## 2023-06-16 ENCOUNTER — Other Ambulatory Visit (HOSPITAL_COMMUNITY): Payer: Self-pay

## 2023-06-16 VITALS — BP 121/71 | HR 82 | Ht 62.0 in | Wt 137.0 lb

## 2023-06-16 DIAGNOSIS — E119 Type 2 diabetes mellitus without complications: Secondary | ICD-10-CM | POA: Diagnosis not present

## 2023-06-16 DIAGNOSIS — Z23 Encounter for immunization: Secondary | ICD-10-CM

## 2023-06-16 DIAGNOSIS — Z0000 Encounter for general adult medical examination without abnormal findings: Secondary | ICD-10-CM

## 2023-06-16 DIAGNOSIS — Z7984 Long term (current) use of oral hypoglycemic drugs: Secondary | ICD-10-CM

## 2023-06-16 DIAGNOSIS — E785 Hyperlipidemia, unspecified: Secondary | ICD-10-CM | POA: Diagnosis not present

## 2023-06-16 MED ORDER — METFORMIN HCL ER 500 MG PO TB24
500.0000 mg | ORAL_TABLET | Freq: Two times a day (BID) | ORAL | 1 refills | Status: DC
  Filled 2023-06-16: qty 180, 90d supply, fill #0
  Filled 2023-09-22: qty 180, 90d supply, fill #1

## 2023-06-16 NOTE — Progress Notes (Unsigned)
Complete physical exam  Patient: Kaitlin Bowman   DOB: [birth year]   52 y.o. Female  MRN: [id number]  Subjective:    Chief Complaint  Patient presents with   Annual Exam    Kaitlin Bowman is a 52 y.o. female who presents today for a complete physical exam. She reports consuming a {diet types:17450} diet. {types:19826} She generally feels {DESC; WELL/FAIRLY WELL/POORLY:18703}. She reports sleeping {DESC; WELL/FAIRLY WELL/POORLY:18703}. She {does/does not:200015} have additional problems to discuss today.    Most recent fall risk assessment:    12/10/2022    1:38 PM  Fall Risk   Falls in the past year? 0  Number falls in past yr: 0  Injury with Fall? 0  Risk for fall due to : No Fall Risks  Follow up Falls evaluation completed     Most recent depression screenings:    12/10/2022    1:38 PM 04/22/2022   10:55 AM  PHQ 2/9 Scores  PHQ - 2 Score 0 2  PHQ- 9 Score  6    {VISON DENTAL STD PSA (Optional):27386}  {History (Optional):23778}  Patient Care Team: Nolene Ebbs as PCP - General (Family Medicine)   Outpatient Medications Prior to Visit  Medication Sig   AMBULATORY NON FORMULARY MEDICATION Freestyle lite  test strips and lancets for T2DM to test three times a day.   ascorbic acid (VITAMIN C) 500 MG tablet Take 500 mg by mouth daily.   aspirin-acetaminophen-caffeine (EXCEDRIN MIGRAINE) 250-250-65 MG tablet Take 1 tablet by mouth every 6 (six) hours as needed (for migraines).    ferrous sulfate 325 (65 FE) MG EC tablet Take 1 tablet (325 mg total) by mouth 2 (two) times daily with a meal. During menstrual cycles (Patient taking differently: Take 325 mg by mouth daily with breakfast. During menstrual cycles  once daily)   glucose blood (FREESTYLE LITE) test strip Test 3 times a day   hydroxypropyl methylcellulose / hypromellose (ISOPTO TEARS / GONIOVISC) 2.5 % ophthalmic solution Place 1 drop into both eyes 4 (four) times daily as needed for dry eyes.   ibuprofen  (ADVIL) 800 MG tablet Take 1 tablet (800 mg total) by mouth every 8 (eight) hours as needed for moderate pain.   Lancets (FREESTYLE) lancets Test 3 (three) times daily.   meclizine (ANTIVERT) 25 MG tablet Take 1 tablet (25 mg total) by mouth 3 (three) times daily as needed for dizziness.   metFORMIN (GLUCOPHAGE-XR) 500 MG 24 hr tablet Take 1 tablet (500 mg total) by mouth 2 (two) times daily.   Multiple Vitamins-Minerals (MULTIVITAMIN ADULTS PO) Take by mouth daily. CENTRUM SILVER   REFRESH OPTIVE MEGA-3 0.5-1-0.5 % SOLN place 1 drop into affected eye as needed for dryness   rosuvastatin (CRESTOR) 10 MG tablet Take one tablet by mouth twice a week. (Patient taking differently: Take 10 mg by mouth 2 (two) times a week. Take one tablet by mouth twice a week.)   triamcinolone cream (KENALOG) 0.5 % Apply 1 Application topically as needed.   White Petrolatum-Mineral Oil (ARTIFICIAL TEARS) ointment apply to affected eye every evening if needed for  dryness   No facility-administered medications prior to visit.    ROS        Objective:     There were no vitals taken for this visit. BP Readings from Last 3 Encounters:  06/16/23 121/71  12/10/22 128/72  08/14/22 (!) 146/80   Wt Readings from Last 3 Encounters:  06/16/23 137 lb (62.1 kg)  08/14/22 137 lb (62.1 kg)  07/31/22 138 lb 11.2 oz (62.9 kg)      Physical Exam      Assessment & Plan:    Routine Health Maintenance and Physical Exam  Immunization History  Administered Date(s) Administered   Influenza-Unspecified 06/20/2015, 06/12/2019, 06/01/2020   PFIZER(Purple Top)SARS-COV-2 Vaccination 09/12/2019, 10/03/2019   Td 02/08/1998   Tdap 03/29/2013   Zoster, Live 02/26/2004, 02/25/2010    Health Maintenance  Topic Date Due   HIV Screening  Never done   OPHTHALMOLOGY EXAM  08/29/2022   INFLUENZA VACCINE  03/19/2023   DTaP/Tdap/Td (3 - Td or Tdap) 03/30/2023   FOOT EXAM  04/23/2023   COVID-19 Vaccine (3 - Pfizer risk  series) 12/26/2023 (Originally 10/31/2019)   HEMOGLOBIN A1C  12/11/2023   Cervical Cancer Screening (HPV/Pap Cotest)  02/29/2024   MAMMOGRAM  03/23/2024   Diabetic kidney evaluation - eGFR measurement  06/11/2024   Diabetic kidney evaluation - Urine ACR  06/11/2024   Colonoscopy  06/17/2032   Hepatitis C Screening  Completed   HPV VACCINES  Aged Out   Zoster Vaccines- Shingrix  Discontinued    Discussed health benefits of physical activity, and encouraged her to engage in regular exercise appropriate for her age and condition.   No follow-ups on file.     Tandy Gaw, PA-C

## 2023-06-16 NOTE — Patient Instructions (Signed)

## 2023-06-17 ENCOUNTER — Other Ambulatory Visit (HOSPITAL_COMMUNITY): Payer: Self-pay

## 2023-06-17 ENCOUNTER — Encounter: Payer: Self-pay | Admitting: Physician Assistant

## 2023-06-18 ENCOUNTER — Encounter: Payer: Self-pay | Admitting: Physician Assistant

## 2023-06-18 ENCOUNTER — Other Ambulatory Visit (HOSPITAL_COMMUNITY): Payer: Self-pay

## 2023-09-17 ENCOUNTER — Other Ambulatory Visit (HOSPITAL_COMMUNITY): Payer: Self-pay

## 2023-09-18 ENCOUNTER — Other Ambulatory Visit (HOSPITAL_COMMUNITY): Payer: Self-pay

## 2023-09-18 ENCOUNTER — Other Ambulatory Visit: Payer: Self-pay

## 2023-09-18 ENCOUNTER — Ambulatory Visit (INDEPENDENT_AMBULATORY_CARE_PROVIDER_SITE_OTHER): Payer: 59 | Admitting: Physician Assistant

## 2023-09-18 VITALS — BP 132/80 | HR 80 | Ht 62.0 in | Wt 135.0 lb

## 2023-09-18 DIAGNOSIS — E119 Type 2 diabetes mellitus without complications: Secondary | ICD-10-CM

## 2023-09-18 DIAGNOSIS — Z7984 Long term (current) use of oral hypoglycemic drugs: Secondary | ICD-10-CM | POA: Diagnosis not present

## 2023-09-18 DIAGNOSIS — E785 Hyperlipidemia, unspecified: Secondary | ICD-10-CM

## 2023-09-18 DIAGNOSIS — I10 Essential (primary) hypertension: Secondary | ICD-10-CM

## 2023-09-18 LAB — POCT GLYCOSYLATED HEMOGLOBIN (HGB A1C): Hemoglobin A1C: 6.4 % — AB (ref 4.0–5.6)

## 2023-09-18 NOTE — Patient Instructions (Signed)

## 2023-09-21 ENCOUNTER — Other Ambulatory Visit (HOSPITAL_COMMUNITY): Payer: Self-pay

## 2023-09-21 ENCOUNTER — Encounter: Payer: Self-pay | Admitting: Physician Assistant

## 2023-09-21 NOTE — Progress Notes (Signed)
Established Patient Office Visit  Subjective   Patient ID: Kaitlin Bowman, female    DOB: 1970-11-26  Age: 53 y.o. MRN: [id number]  Chief Complaint  Patient presents with   Medical Management of Chronic Issues    Last A1c 6.8    HPI Pt is a 53 yo female with T2DM, HTN, HLD who presents to the clinic for follow up on A1C. Last A1C was rising some. Pt has watched diet and exercised daily. No concerns. She is on metformin only for diabetes. She is not checking her sugars. Denies any hypoglycemic events. No open sores or wounds. No SI/HC.   Marland Kitchen. 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   Vitamin D deficiency 12/11/2017   Graves disease 12/11/2017   Vertigo 12/11/2017   Irregular periods 01/26/2018   Anemia 01/11/2019   Microalbuminuria 01/14/2019   Hyperlipidemia LDL goal <70 01/14/2019   Palpitations 03/12/2020   Lumbar facet arthropathy 10/01/2020   Calcification of tendon 10/02/2020   Uterine polyp 07/31/2022   History of Graves' disease 09/02/2022   Throat discomfort 12/11/2022   Swallowing dysfunction 12/11/2022   Resolved Ambulatory Problems    Diagnosis Date Noted   Well woman exam without gynecological exam 01/26/2018   Essential hypertension 01/14/2019   Past Medical History:  Diagnosis Date   Endometrial polyp    Environmental allergies    Goiter with hyperthyroidism 2009   History of positive PPD 2015   Hyperlipidemia    Hypertension    IDA (iron deficiency anemia)    Intermittent palpitations    Menorrhagia    Type 2 diabetes mellitus (HCC)    Wears glasses      Review of Systems  All other systems reviewed and are negative.     Objective:     BP 132/80   Pulse 80   Ht 5\' 2"  (1.575 m)   Wt 135 lb (61.2 kg)   SpO2 99%   BMI 24.69  kg/m  BP Readings from Last 3 Encounters:  09/18/23 132/80  06/16/23 121/71  12/10/22 128/72   Wt Readings from Last 3 Encounters:  09/18/23 135 lb (61.2 kg)  06/16/23 137 lb (62.1 kg)  08/14/22 137 lb (62.1 kg)      Physical Exam Constitutional:      Appearance: Normal appearance.  HENT:     Head: Normocephalic.  Cardiovascular:     Rate and Rhythm: Normal rate and regular rhythm.  Pulmonary:     Effort: Pulmonary effort is normal.     Breath sounds: Normal breath sounds.  Neurological:     General: No focal deficit present.     Mental Status: She is alert and oriented to person, place, and time.  Psychiatric:        Mood and Affect: Mood normal.      Results for orders placed or performed in visit on 09/18/23  POCT HgB A1C  Result Value Ref Range   Hemoglobin A1C 6.4 (A) 4.0 - 5.6 %   HbA1c POC (<> result, manual entry)     HbA1c, POC (prediabetic range)     HbA1c, POC (controlled diabetic range)       The 10-year ASCVD risk score (Arnett DK, et al., 2019) is: 2.2%    Assessment & Plan:  Marland KitchenMarland KitchenHye [employer]  was seen today for medical management of chronic issues.  Diagnoses and all orders for this visit:  Type 2 diabetes mellitus without complication, without long-term current use of insulin (HCC) -     POCT HgB A1C  Essential hypertension  Hyperlipidemia LDL goal <70   A1C improved  Stay on metformin Agreed to follow up in 6 months On statin BP to goal Declined covid vaccine Flu shot UTD   Return in about 6 months (around 03/17/2024).    Tandy Gaw, PA-C

## 2023-09-22 ENCOUNTER — Other Ambulatory Visit: Payer: Self-pay

## 2023-09-22 ENCOUNTER — Other Ambulatory Visit (HOSPITAL_COMMUNITY): Payer: Self-pay

## 2023-09-23 ENCOUNTER — Other Ambulatory Visit (HOSPITAL_COMMUNITY): Payer: Self-pay

## 2023-09-24 ENCOUNTER — Other Ambulatory Visit: Payer: Self-pay

## 2023-09-24 ENCOUNTER — Other Ambulatory Visit (HOSPITAL_COMMUNITY): Payer: Self-pay

## 2023-09-25 ENCOUNTER — Other Ambulatory Visit (HOSPITAL_COMMUNITY): Payer: Self-pay

## 2023-12-07 ENCOUNTER — Other Ambulatory Visit: Payer: Self-pay | Admitting: Physician Assistant

## 2023-12-07 DIAGNOSIS — R1310 Dysphagia, unspecified: Secondary | ICD-10-CM

## 2023-12-07 DIAGNOSIS — R070 Pain in throat: Secondary | ICD-10-CM

## 2023-12-07 DIAGNOSIS — Z8639 Personal history of other endocrine, nutritional and metabolic disease: Secondary | ICD-10-CM

## 2023-12-29 ENCOUNTER — Other Ambulatory Visit: Payer: Self-pay | Admitting: Physician Assistant

## 2023-12-29 DIAGNOSIS — Z1231 Encounter for screening mammogram for malignant neoplasm of breast: Secondary | ICD-10-CM

## 2024-01-01 ENCOUNTER — Other Ambulatory Visit (HOSPITAL_COMMUNITY): Payer: Self-pay

## 2024-01-01 ENCOUNTER — Other Ambulatory Visit: Payer: Self-pay | Admitting: Physician Assistant

## 2024-01-01 DIAGNOSIS — E119 Type 2 diabetes mellitus without complications: Secondary | ICD-10-CM

## 2024-01-01 MED ORDER — METFORMIN HCL ER 500 MG PO TB24
500.0000 mg | ORAL_TABLET | Freq: Two times a day (BID) | ORAL | 1 refills | Status: DC
  Filled 2024-01-01: qty 180, 90d supply, fill #0
  Filled 2024-03-15: qty 180, 90d supply, fill #1

## 2024-02-16 ENCOUNTER — Ambulatory Visit: Payer: 59 | Admitting: Physician Assistant

## 2024-02-23 ENCOUNTER — Ambulatory Visit: Admitting: Physician Assistant

## 2024-02-23 ENCOUNTER — Encounter: Payer: Self-pay | Admitting: Physician Assistant

## 2024-02-23 ENCOUNTER — Other Ambulatory Visit (HOSPITAL_COMMUNITY): Payer: Self-pay

## 2024-02-23 VITALS — BP 128/82 | HR 77 | Wt 132.0 lb

## 2024-02-23 DIAGNOSIS — E785 Hyperlipidemia, unspecified: Secondary | ICD-10-CM

## 2024-02-23 DIAGNOSIS — M25511 Pain in right shoulder: Secondary | ICD-10-CM

## 2024-02-23 DIAGNOSIS — E119 Type 2 diabetes mellitus without complications: Secondary | ICD-10-CM

## 2024-02-23 DIAGNOSIS — I10 Essential (primary) hypertension: Secondary | ICD-10-CM | POA: Diagnosis not present

## 2024-02-23 DIAGNOSIS — Z7984 Long term (current) use of oral hypoglycemic drugs: Secondary | ICD-10-CM

## 2024-02-23 DIAGNOSIS — M25512 Pain in left shoulder: Secondary | ICD-10-CM

## 2024-02-23 LAB — POCT GLYCOSYLATED HEMOGLOBIN (HGB A1C): Hemoglobin A1C: 6.4 % — AB (ref 4.0–5.6)

## 2024-02-23 MED ORDER — ROSUVASTATIN CALCIUM 10 MG PO TABS
10.0000 mg | ORAL_TABLET | ORAL | 1 refills | Status: AC
  Filled 2024-02-23: qty 24, 84d supply, fill #0

## 2024-02-23 MED ORDER — DICLOFENAC SODIUM 1 % EX GEL
4.0000 g | Freq: Four times a day (QID) | CUTANEOUS | 1 refills | Status: AC
  Filled 2024-02-23: qty 100, 7d supply, fill #0

## 2024-02-23 NOTE — Patient Instructions (Signed)
Proximal Biceps Tendinitis and Tenosynovitis Rehab Ask your health care provider which exercises are safe for you. Do exercises exactly as told by your provider and adjust them as directed. It is normal to feel mild stretching, pulling, tightness, or discomfort as you do these exercises. Stop right away if you feel sudden pain or your pain gets worse. Do not begin these exercises until told by your provider. Stretching and range-of-motion exercises These exercises warm up your muscles and joints. They can help improve the movement and flexibility of your arm and shoulder. They may also help to relieve pain and stiffness. Forearm rotation, supination  Stand up or sit with your left / right elbow bent in a 90-degree angle (right angle). Turn (rotate) your left / right palm up (supination) until you cannot rotate it anymore. Then, use your other hand to help turn your left / right forearm more. Hold this position for __________ seconds. Slowly return to the starting position. Repeat __________ times. Complete this exercise __________ times a day. Forearm rotation, pronation  Stand up or sit with your left / right elbow bent in a 90-degree angle (right angle). Turn (rotate) your left / right palm down (pronation) until you cannot rotate it anymore. Then, use your other hand to help turn your left / right forearm more. Hold this position for __________ seconds. Slowly return to the starting position. Elbow range of motion  Stand or sit with your left / right elbow bent in a 90-degree angle (right angle). Position your forearm so that the thumb is facing the ceiling (neutral position). Slowly bend your elbow as far as you can until you feel a stretch or cannot go any farther. Hold this position for __________ seconds. Slowly straighten your elbow as far as you can until you feel a stretch or cannot go any farther. Hold this position for __________ seconds. Repeat __________ times. Complete this  exercise __________ times a day. Biceps stretch  Stand facing a wall, or stand by a door frame. Raise your left / right arm out to your side, to your shoulder height. Place the thumb side of your hand against the wall. Your palm should be facing the floor (palm down). Keeping your arm straight, rotate your body in the opposite direction of the raised arm until you feel a gentle stretch in your biceps. Hold this position for __________ seconds. Slowly return to the starting position. Repeat __________ times. Complete this exercise __________ times a day. Shoulder pendulum  Stand near a table or counter that you can hold onto for balance. Bend forward at the waist and let your left / right arm hang straight down. Use your other arm to support you and help you stay balanced. Relax your left / right arm and shoulder muscles. Move your hips and your trunk so your left / right arm swings freely. Your arm should swing because of the motion of your body, not because you are using your arm or shoulder muscles. Keep moving your hips and trunk so your arm swings in the following directions, as told by your provider: Side to side. Forward and backward. In clockwise and counterclockwise circles. Repeat __________ times. Complete this exercise __________ times a day. Shoulder flexion, assisted  Stand facing a wall. Put your left / right palm on the wall. Slowly move your left / right hand up the wall (flexion). Stop when you feel a stretch in your shoulder, or when you reach the angle that is recommended by your provider.  Use your other hand to help raise your arm, if needed (assisted). As your hand gets higher, you may need to step closer to the wall. Avoid shrugging or lifting your shoulder up as you raise your arm. To do this, keep your shoulder blade tucked down toward your spine. Hold this position for __________ seconds. Slowly return to the starting position. Use your other arm to help, if  needed. Repeat __________ times. Complete this exercise __________ times a day. Shoulder flexion  Stand with your left / right arm hanging down at your side. Keep your arm straight as you lift your arm forward and toward the ceiling (flexion). Hold this position for __________ seconds. Slowly return to the starting position. Repeat __________ times. Complete this exercise __________ times a day. Sleeper stretch, assisted  Lie on your left / right side (injured side) with your hips and knees bent and your left / right arm straight in front of you. Bend your elbow to a 90-degree angle (right angle), so your fingers are pointing to the ceiling. Use your other hand to gently push your arm toward the floor (assisted), stopping when you feel a gentle stretch. Keep your shoulder blades lightly squeezed together during the exercise. Hold this position for __________ seconds. Slowly return to the starting position. Repeat __________ times. Complete this exercise __________ times a day. Strengthening exercises These exercises build strength and endurance in your arm and shoulder. Endurance is the ability to use your muscles for a long time, even after they get tired. Biceps curls You can use a weight or an exercise band for this exercise. Sit on a stable chair without armrests, or stand up. Hold a __________ lb / kg weight in your left / right hand, or hold an exercise band with both hands. Your palms should face up toward the ceiling at the starting position. Bend your left / right elbow and move your hand up toward your shoulder. Keep your other arm straight down, in the starting position. Hold this position for __________ seconds. Slowly return to the starting position. Repeat __________ times. Complete this exercise __________ times a day. Internal shoulder rotation You will use an exercise band secured to a stable object at waist height for this exercise. A door and doorframe work  well. Stand sideways next to a door with your left / right arm closest to the door, holding the exercise band in your hand. With your elbow bent in a 90-degree angle (right angle) and keeping your elbow at your side, bring your hand toward your belly (internal rotation). Make sure your wrist is staying straight as you do this exercise. Hold this position for __________ seconds. Slowly return to the starting position. Repeat __________ times. Complete this exercise __________ times a day. External shoulder rotation You will use an exercise band secured to a stable object at waist height for this exercise. A door and doorframe work well. Stand sideways next to a door with your left / right arm away from the door, holding the exercise band in your hand. With your elbow bent in a 90-degree angle (right angle) and keeping your elbow at your side, swing your arm away from your body (external rotation). Make sure your wrist is staying straight as you do this exercise. Hold this position for __________ seconds. Slowly return to the starting position. Repeat __________ times. Complete this exercise __________ times a day. External shoulder rotation, side-lying You will use a weight to do this exercise. Lie on your uninjured  side with your left / right arm at your side. Bend your elbow to a 90-degree angle (right angle). Hold a __________ lb / kg weight in your left / right hand. Keeping your elbow at your side, raise your arm toward the ceiling (external rotation). Make sure your wrist is staying straight as you do this exercise. Hold this position for __________ seconds. Slowly return to the starting position. Repeat __________ times. Complete this exercise __________ times a day. Scapular retraction Scapular retraction is the process of pulling the shoulder blades (scapulae) toward each other, and toward the spine. You will need an exercise band to do this exercise. Sit in a stable chair without  armrests, or stand up. Secure an exercise band to a stable object in front of you so the band is at shoulder height. Hold one end of the exercise band in each hand. Squeeze your shoulder blades together and move your elbows slightly behind you (retraction). Do not shrug your shoulders upward while you do this. Hold this position for __________ seconds. Slowly return to the starting position. Repeat __________ times. Complete this exercise __________ times a day. Scapular protraction, supine Scapular protraction is the process of moving your shoulder blades away from each other, and away from the spine, while you lie on your back (supine position). Lie on your back on a firm surface. Hold a __________ lb / kg weight in your left / right hand. Raise your left / right arm straight into the air so your hand is directly above your shoulder joint. Push the weight into the air so your shoulder (scapula) lifts off the surface that you are lying on. Think of trying to punch the ceiling by only moving your scapula forward (protraction). Do not move your head, neck, or back. Hold this position for __________ seconds. Slowly return to the starting position. Repeat __________ times. Complete this exercise __________ times a day. This information is not intended to replace advice given to you by your health care provider. Make sure you discuss any questions you have with your health care provider. Document Revised: 03/07/2022 Document Reviewed: 03/07/2022 Elsevier Patient Education  2024 [employer].

## 2024-02-23 NOTE — Progress Notes (Signed)
   Established Patient Office Visit  Subjective   Patient ID: Kaitlin Bowman, female    DOB: 1970-10-24  Age: 53 y.o. MRN: [id number]  Chief Complaint  Patient presents with   Diabetes    Diabetes follow up visit    HPI Pt is a 53 yo female with T2DM who presents to the clinic to follow up.   Pt is taking metformin . She is not checking her sugars regularly. She denies any CP, palpitations, headaches, or vision changes. She is active at work. She denies any hypoglycemic events.   She is having some bilateral anterior shoulder pain for the last few weeks. 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 Her ROM is good. She denies any trauma. She is not doing anything to make better.    ROS See HPI.    Objective:     BP 128/82   Pulse 77   Wt 132 lb (59.9 kg)   SpO2 99%   BMI 24.14 kg/m  BP Readings from Last 3 Encounters:  02/23/24 128/82  09/18/23 132/80  06/16/23 121/71   Wt Readings from Last 3 Encounters:  02/23/24 132 lb (59.9 kg)  09/18/23 135 lb (61.2 kg)  06/16/23 137 lb (62.1 kg)    .Kaitlin Results for orders placed or performed in visit on 02/23/24  POCT HgB A1C   Collection Time: 02/23/24  2:11 PM  Result Value Ref Range   Hemoglobin A1C 6.4 (A) 4.0 - 5.6 %   HbA1c POC (<> result, manual entry)     HbA1c, POC (prediabetic range)     HbA1c, POC (controlled diabetic range)       Physical Exam Constitutional:      Appearance: Normal appearance.  HENT:     Head: Normocephalic.  Cardiovascular:     Rate and Rhythm: Normal rate and regular rhythm.  Pulmonary:     Effort: Pulmonary effort is normal.     Breath sounds: Normal breath sounds.  Musculoskeletal:     Comments: Bilateral anterior shoulder tenderness to palpation  NROM of both shoulders with 5/5 strength Discomfort with external rotation of both but very mild Positive yeragson, bilaterally.    Neurological:     Mental Status: She is alert and oriented to person, place, and time.   Psychiatric:        Mood and Affect: Mood normal.       The 10-year ASCVD risk score (Arnett DK, et al., 2019) is: 2%    Assessment & Plan:  Kaitlin Bowman was seen today for diabetes.  Diagnoses and all orders for this visit:  Type 2 diabetes mellitus without complication, without long-term current use of insulin (HCC) -     POCT HgB A1C  Essential hypertension  Hyperlipidemia LDL goal <70 -     rosuvastatin  (CRESTOR ) 10 MG tablet; Take 1 tablet (10 mg total) by mouth 2 (two) times a week.  Acute pain of both shoulders -     diclofenac  Sodium (VOLTAREN ) 1 % GEL; Apply 4 g topically 4 (four) times daily. To affected joint.  A1C is to goal Continue metformin  BP to goal On statin Eye and foot exam UTD Follow up in 3 months  Bilateral anterior shoulder pain appears like bicep tendonitis  Exercises given Diclofenac  up to 4 times a day to try Follow up if not improving or symptoms worsening  Return in about 6 months (around 08/25/2024).    Eiko Mcgowen, PA-C

## 2024-02-24 ENCOUNTER — Encounter: Payer: Self-pay | Admitting: Physician Assistant

## 2024-02-24 DIAGNOSIS — I10 Essential (primary) hypertension: Secondary | ICD-10-CM | POA: Insufficient documentation

## 2024-03-15 ENCOUNTER — Other Ambulatory Visit: Payer: Self-pay

## 2024-03-24 ENCOUNTER — Ambulatory Visit
Admission: RE | Admit: 2024-03-24 | Discharge: 2024-03-24 | Disposition: A | Discharge status: Home / Self Care | Source: Ambulatory Visit | Attending: Physician Assistant | Admitting: Physician Assistant

## 2024-03-24 DIAGNOSIS — Z1231 Encounter for screening mammogram for malignant neoplasm of breast: Secondary | ICD-10-CM | POA: Diagnosis not present

## 2024-03-28 ENCOUNTER — Ambulatory Visit: Payer: Self-pay | Admitting: Physician Assistant

## 2024-03-28 NOTE — Progress Notes (Signed)
 Normal mammogram. Follow up in one year.

## 2024-04-21 DIAGNOSIS — H5213 Myopia, bilateral: Secondary | ICD-10-CM | POA: Diagnosis not present

## 2024-04-21 DIAGNOSIS — Z7984 Long term (current) use of oral hypoglycemic drugs: Secondary | ICD-10-CM | POA: Diagnosis not present

## 2024-04-21 DIAGNOSIS — H524 Presbyopia: Secondary | ICD-10-CM | POA: Diagnosis not present

## 2024-04-21 DIAGNOSIS — E119 Type 2 diabetes mellitus without complications: Secondary | ICD-10-CM | POA: Diagnosis not present

## 2024-06-13 ENCOUNTER — Encounter: Payer: Self-pay | Admitting: Physician Assistant

## 2024-06-13 DIAGNOSIS — E119 Type 2 diabetes mellitus without complications: Secondary | ICD-10-CM

## 2024-06-13 DIAGNOSIS — Z0000 Encounter for general adult medical examination without abnormal findings: Secondary | ICD-10-CM

## 2024-06-13 DIAGNOSIS — Z8639 Personal history of other endocrine, nutritional and metabolic disease: Secondary | ICD-10-CM

## 2024-06-13 DIAGNOSIS — E785 Hyperlipidemia, unspecified: Secondary | ICD-10-CM

## 2024-06-13 DIAGNOSIS — I10 Essential (primary) hypertension: Secondary | ICD-10-CM

## 2024-06-14 DIAGNOSIS — I10 Essential (primary) hypertension: Secondary | ICD-10-CM | POA: Diagnosis not present

## 2024-06-14 DIAGNOSIS — E785 Hyperlipidemia, unspecified: Secondary | ICD-10-CM | POA: Diagnosis not present

## 2024-06-14 DIAGNOSIS — Z0000 Encounter for general adult medical examination without abnormal findings: Secondary | ICD-10-CM | POA: Diagnosis not present

## 2024-06-14 DIAGNOSIS — E119 Type 2 diabetes mellitus without complications: Secondary | ICD-10-CM | POA: Diagnosis not present

## 2024-06-15 LAB — CMP14+EGFR
ALT: 13 IU/L (ref 0–32)
AST: 18 IU/L (ref 0–40)
Albumin: 4.6 g/dL (ref 3.8–4.9)
Alkaline Phosphatase: 69 IU/L (ref 49–135)
BUN/Creatinine Ratio: 25 — ABNORMAL HIGH (ref 9–23)
BUN: 16 mg/dL (ref 6–24)
Bilirubin Total: 0.3 mg/dL (ref 0.0–1.2)
CO2: 22 mmol/L (ref 20–29)
Calcium: 9 mg/dL (ref 8.7–10.2)
Chloride: 101 mmol/L (ref 96–106)
Creatinine, Ser: 0.64 mg/dL (ref 0.57–1.00)
Globulin, Total: 2.8 g/dL (ref 1.5–4.5)
Glucose: 114 mg/dL — ABNORMAL HIGH (ref 70–99)
Potassium: 4.1 mmol/L (ref 3.5–5.2)
Sodium: 138 mmol/L (ref 134–144)
Total Protein: 7.4 g/dL (ref 6.0–8.5)
eGFR: 105 mL/min/1.73 (ref >59)

## 2024-06-15 LAB — CBC WITH DIFFERENTIAL/PLATELET
Basophils Absolute: 0.1 x10E3/uL (ref 0.0–0.2)
Basos: 2 %
EOS (ABSOLUTE): 0.1 x10E3/uL (ref 0.0–0.4)
Eos: 1 %
Hematocrit: 37.5 % (ref 34.0–46.6)
Hemoglobin: 11.2 g/dL (ref 11.1–15.9)
Immature Grans (Abs): 0 x10E3/uL (ref 0.0–0.1)
Immature Granulocytes: 0 %
Lymphocytes Absolute: 1.6 x10E3/uL (ref 0.7–3.1)
Lymphs: 31 %
MCH: 23.6 pg — ABNORMAL LOW (ref 26.6–33.0)
MCHC: 29.9 g/dL — ABNORMAL LOW (ref 31.5–35.7)
MCV: 79 fL (ref 79–97)
Monocytes Absolute: 0.4 x10E3/uL (ref 0.1–0.9)
Monocytes: 7 %
Neutrophils Absolute: 3.1 x10E3/uL (ref 1.4–7.0)
Neutrophils: 59 %
Platelets: 445 x10E3/uL (ref 150–450)
RBC: 4.74 x10E6/uL (ref 3.77–5.28)
RDW: 15.8 % — ABNORMAL HIGH (ref 11.7–15.4)
WBC: 5.3 x10E3/uL (ref 3.4–10.8)

## 2024-06-15 LAB — LIPID PANEL
Chol/HDL Ratio: 2.7 ratio (ref 0.0–4.4)
Cholesterol, Total: 212 mg/dL — ABNORMAL HIGH (ref 100–199)
HDL: 79 mg/dL (ref >39)
LDL Chol Calc (NIH): 118 mg/dL — ABNORMAL HIGH (ref 0–99)
Triglycerides: 86 mg/dL (ref 0–149)
VLDL Cholesterol Cal: 15 mg/dL (ref 5–40)

## 2024-06-15 LAB — MICROALBUMIN / CREATININE URINE RATIO
Creatinine, Urine: 96.2 mg/dL
Microalb/Creat Ratio: 3 mg/g{creat} (ref 0–29)
Microalbumin, Urine: 3 ug/mL

## 2024-06-15 LAB — TSH: TSH: 3.58 u[IU]/mL (ref 0.450–4.500)

## 2024-06-15 LAB — HEMOGLOBIN A1C
Est. average glucose Bld gHb Est-mCnc: 148 mg/dL
Hgb A1c MFr Bld: 6.8 % — ABNORMAL HIGH (ref 4.8–5.6)

## 2024-06-17 ENCOUNTER — Ambulatory Visit (INDEPENDENT_AMBULATORY_CARE_PROVIDER_SITE_OTHER): Admitting: Physician Assistant

## 2024-06-17 ENCOUNTER — Other Ambulatory Visit: Payer: Self-pay

## 2024-06-17 ENCOUNTER — Other Ambulatory Visit (HOSPITAL_COMMUNITY): Payer: Self-pay

## 2024-06-17 VITALS — BP 135/72 | HR 99 | Ht 62.0 in | Wt 136.0 lb

## 2024-06-17 DIAGNOSIS — M2550 Pain in unspecified joint: Secondary | ICD-10-CM

## 2024-06-17 DIAGNOSIS — H93A1 Pulsatile tinnitus, right ear: Secondary | ICD-10-CM

## 2024-06-17 DIAGNOSIS — I10 Essential (primary) hypertension: Secondary | ICD-10-CM | POA: Diagnosis not present

## 2024-06-17 DIAGNOSIS — Z0000 Encounter for general adult medical examination without abnormal findings: Secondary | ICD-10-CM

## 2024-06-17 DIAGNOSIS — E785 Hyperlipidemia, unspecified: Secondary | ICD-10-CM

## 2024-06-17 DIAGNOSIS — Z7984 Long term (current) use of oral hypoglycemic drugs: Secondary | ICD-10-CM

## 2024-06-17 DIAGNOSIS — R42 Dizziness and giddiness: Secondary | ICD-10-CM

## 2024-06-17 DIAGNOSIS — E1169 Type 2 diabetes mellitus with other specified complication: Secondary | ICD-10-CM

## 2024-06-17 DIAGNOSIS — E119 Type 2 diabetes mellitus without complications: Secondary | ICD-10-CM

## 2024-06-17 DIAGNOSIS — N951 Menopausal and female climacteric states: Secondary | ICD-10-CM

## 2024-06-17 MED ORDER — MECLIZINE HCL 25 MG PO TABS
25.0000 mg | ORAL_TABLET | Freq: Three times a day (TID) | ORAL | 0 refills | Status: AC | PRN
  Filled 2024-06-17: qty 90, 30d supply, fill #0

## 2024-06-17 MED ORDER — METFORMIN HCL ER 500 MG PO TB24
500.0000 mg | ORAL_TABLET | Freq: Two times a day (BID) | ORAL | 1 refills | Status: AC
  Filled 2024-06-17: qty 180, 90d supply, fill #0
  Filled 2024-09-20: qty 180, 90d supply, fill #1

## 2024-06-17 NOTE — Progress Notes (Signed)
 Complete physical exam  Patient: Kaitlin Bowman   DOB: 12-Oct-1970   53 y.o. Female  MRN: [id number]  Subjective:    Chief Complaint  Patient presents with   Annual Exam   Discussed the use of AI scribe software for clinical note transcription with the patient, who gave verbal consent to proceed.  History of Present Illness Kaitlin Bowman is a 53 year old female with hypertension and type 2 diabetes who presents for a follow-up visit.  Blood pressure monitoring - Home blood pressure readings average 117/84 mmHg, lower than clinic measurements - Not currently taking antihypertensive medication  Glycemic control - Recent hemoglobin A1c is 6.8% - Attributes increase to dietary changes, including increased snacking and consumption of protein drinks - Continues metformin  for diabetes management - Denies any hypoglycemic events, not checking sugars at home.  - eye exam UTD  Statin intolerance - Not taking prescribed rosuvastatin  regularly due to shoulder pain, suspected to be related to the medication  Menopausal symptoms - Amenorrhea for the past 3-4 months - Stable weight - Perceived decrease in metabolism  Sleep disturbance and musculoskeletal symptoms - Frequent nighttime awakenings - Generalized body stiffness, especially when rising from a seated position - Swelling in hands, particularly after consuming salty foods - Difficulty making a fist, associated with concern for developing arthritis  Auditory vascular sensation - Sensation of hearing blood flow in the right ear when bending neck - No similar sensation in the left ear - ongoing dizziness but no vision changes  Preventive health and lifestyle - Walking daily for exercise - Received influenza vaccination - Has not received pneumonia vaccine - Due for Pap smear this year   Most recent fall risk assessment:    06/20/2024    6:27 AM  Fall Risk   Falls in the past year? 0  Number falls in past yr: 0  Injury with  Fall? 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   Hypertension goal BP (blood pressure) < 130/80 02/24/2024   Throat discomfort 12/11/2022   Swallowing dysfunction 12/11/2022   History of Graves' disease 09/02/2022   Uterine polyp 07/31/2022   Calcification of tendon 10/02/2020   Lumbar facet arthropathy 10/01/2020   Palpitations 03/12/2020   Microalbuminuria 01/14/2019   Hyperlipidemia LDL goal <70 01/14/2019   Anemia 01/11/2019   Irregular periods 01/26/2018   Goiter 12/11/2017   Anemia, iron deficiency 12/11/2017   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   Vitamin D  deficiency 12/11/2017   Graves disease 12/11/2017   Vertigo 12/11/2017   Type 2 diabetes mellitus without complication, without long-term current use of insulin (HCC) 12/01/2017   Past Medical History:  Diagnosis Date   Endometrial polyp    Environmental allergies    Goiter with hyperthyroidism 2009   (07-29-2022 per pt had taken medication for 2-3 wks and labs ok , pcp continues to monitor, no meds)   History of positive PPD 2015   per pt was false positive ,  blood test  was negative   Hyperlipidemia    Hypertension    followed by pcp--- per pt monitoring at home, no med   IDA (iron deficiency anemia)    Intermittent palpitations    per pt once in a while   Menorrhagia    Type 2 diabetes mellitus (HCC)    followed by pcp  (07-29-2022  per pt checks blood sugar once a month)   Vertigo    Wears glasses    Past Surgical History:  Procedure Laterality Date   COLONOSCOPY WITH PROPOFOL   06/17/2022    dr Kaitlin   DILATATION & CURETTAGE/HYSTEROSCOPY WITH MYOSURE N/A 07/31/2022   Procedure: DILATATION & CURETTAGE/HYSTEROSCOPY WITH MYOSURE;  Surgeon: Danielle Rom, MD;  Location: [facility];  Service: Gynecology;  Laterality: N/A;   HYSTEROSCOPY WITH D & C  2016   POLYPS REMOVED   Family History  Problem Relation Age of Onset   Diabetes Mother 2   Heart disease Mother    Cancer Mother        cervial   Heart disease Father    Dementia Father    Diabetes Brother    Cancer Maternal Aunt        Thyroid    Heart disease Maternal Grandmother    Anxiety disorder Neg Hx    Colon cancer Neg Hx    Colon polyps Neg Hx    Crohn's disease Neg Hx    Esophageal cancer Neg Hx    Rectal cancer Neg Hx    Stomach cancer Neg Hx    Ulcerative colitis Neg Hx    Allergies  Allergen Reactions   Farxiga  [Dapagliflozin ]     Per pt near syncope   Lipitor [Atorvastatin ]     myalgias   Lisinopril      cough   Mango Flavor [Flavoring Agent (Non-Screening)] Swelling and Other (See Comments)    Mouth and airway both swell (no shortness of breath, however) Blisters, also   Other Anxiety    Med (name not recalled by the patient) that treated nausea/vomiting- NOT Promethazine, Compazine, or Zofran ; I asked) per pt this happened approx 2006      Patient Care Team: Nelta Caudill L, PA-C as PCP - General (Family Medicine)   Outpatient Medications Prior to Visit  Medication Sig   AMBULATORY NON FORMULARY MEDICATION Freestyle lite  test strips and lancets for T2DM to test three times a day.   ascorbic acid (VITAMIN C) 500 MG tablet Take 500 mg by mouth daily.   aspirin-acetaminophen -caffeine (EXCEDRIN MIGRAINE) 250-250-65 MG tablet Take 1 tablet by mouth every 6 (six) hours as needed (for migraines).    diclofenac  Sodium (VOLTAREN ) 1 % GEL Apply 4 g topically 4 (four) times daily. To affected joint.   ferrous sulfate  325 (65 FE) MG EC tablet Take 1 tablet (325 mg total) by  mouth 2 (two) times daily with a meal. During menstrual cycles (Patient taking differently: Take 325 mg by mouth daily with breakfast. During menstrual cycles  once daily)   glucose blood (FREESTYLE LITE) test strip Test 3 times a day   hydroxypropyl methylcellulose / hypromellose (ISOPTO TEARS / GONIOVISC) 2.5 % ophthalmic solution Place 1 drop into both eyes 4 (four) times daily as needed for dry eyes.   Lancets (FREESTYLE) lancets Test 3 (three) times daily.   Multiple Vitamins-Minerals (MULTIVITAMIN ADULTS PO) Take by mouth daily. CENTRUM SILVER   REFRESH OPTIVE MEGA-3 0.5-1-0.5 % SOLN place 1 drop into affected eye as needed for dryness  rosuvastatin  (CRESTOR ) 10 MG tablet Take 1 tablet (10 mg total) by mouth 2 (two) times a week.   triamcinolone cream (KENALOG) 0.5 % Apply 1 Application topically as needed.   White Petrolatum -Mineral Oil (ARTIFICIAL TEARS) ointment apply to affected eye every evening if needed for  dryness   [DISCONTINUED] ibuprofen  (ADVIL ) 800 MG tablet Take 1 tablet (800 mg total) by mouth every 8 (eight) hours as needed for moderate pain.   [DISCONTINUED] meclizine  (ANTIVERT ) 25 MG tablet Take 1 tablet (25 mg total) by mouth 3 (three) times daily as needed for dizziness.   [DISCONTINUED] metFORMIN  (GLUCOPHAGE -XR) 500 MG 24 hr tablet Take 1 tablet (500 mg total) by mouth 2 (two) times daily.   No facility-administered medications prior to visit.    ROS See HPI.      Objective:     BP 135/72   Pulse 99   Ht 5' 2 (1.575 m)   Wt 136 lb (61.7 kg)   SpO2 99%   BMI 24.87 kg/m  BP Readings from Last 3 Encounters:  06/17/24 135/72  02/23/24 128/82  09/18/23 132/80   Wt Readings from Last 3 Encounters:  06/17/24 136 lb (61.7 kg)  02/23/24 132 lb (59.9 kg)  09/18/23 135 lb (61.2 kg)      Physical Exam  BP 135/72   Pulse 99   Ht 5' 2 (1.575 m)   Wt 136 lb (61.7 kg)   SpO2 99%   BMI 24.87 kg/m   General Appearance:    Alert, cooperative, no distress,  appears stated age  Head:    Normocephalic, without obvious abnormality, atraumatic  Eyes:    PERRL, conjunctiva/corneas clear, EOM's intact, fundi    benign, both eyes  Ears:    Normal TM's and external ear canals, both ears  Nose:   Nares normal, septum midline, mucosa normal, no drainage    or sinus tenderness  Throat:   Lips, mucosa, and tongue normal; teeth and gums normal  Neck:   Supple, symmetrical, trachea midline, no adenopathy;    thyroid :  no enlargement/tenderness/nodules; no carotid   bruit or JVD  Back:     Symmetric, no curvature, ROM normal, no CVA tenderness  Lungs:     Clear to auscultation bilaterally, respirations unlabored  Chest Wall:    No tenderness or deformity   Heart:    Regular rate and rhythm, S1 and S2 normal, no murmur, rub   or gallop     Abdomen:     Soft, non-tender, bowel sounds active all four quadrants,    no masses, no organomegaly        Extremities:   Extremities normal, atraumatic, no cyanosis or edema  Pulses:   2+ and symmetric all extremities  Skin:   Skin color, texture, turgor normal, no rashes or lesions  Lymph nodes:   Cervical, supraclavicular, and axillary nodes normal  Neurologic:   CNII-XII intact, normal strength, sensation and reflexes    throughout     Assessment & Plan:    Routine Health Maintenance and Physical Exam  Immunization History  Administered Date(s) Administered   Influenza-Unspecified 06/20/2015, 06/12/2019, 06/01/2020, 06/12/2023, 06/06/2024   PFIZER(Purple Top)SARS-COV-2 Vaccination 09/12/2019, 10/03/2019   Td 02/08/1998   Tdap 03/29/2013, 06/16/2023   Zoster, Live 02/26/2004, 02/25/2010    Health Maintenance  Topic Date Due   Hepatitis B Vaccines 19-59 Average Risk (1 of 3 - 19+ 3-dose series) Never done   OPHTHALMOLOGY EXAM  04/15/2024   FOOT EXAM  06/15/2024  COVID-19 Vaccine (3 - 2025-26 season) 07/03/2024 (Originally 04/18/2024)   HIV Screening  09/17/2024 (Originally 03/22/1985)   Pneumococcal  Vaccine: 50+ Years (1 of 2 - PCV) 06/17/2025 (Originally 03/22/1989)   Cervical Cancer Screening (HPV/Pap Cotest)  06/20/2025 (Originally 02/29/2024)   HEMOGLOBIN A1C  12/13/2024   Mammogram  03/24/2025   Diabetic kidney evaluation - eGFR measurement  06/14/2025   Diabetic kidney evaluation - Urine ACR  06/14/2025   Colonoscopy  06/17/2032   DTaP/Tdap/Td (4 - Td or Tdap) 06/15/2033   Influenza Vaccine  Completed   Hepatitis C Screening  Completed   HPV VACCINES  Aged Out   Meningococcal B Vaccine  Aged Out   Zoster Vaccines- Shingrix  Discontinued    Discussed health benefits of physical activity, and encouraged her to engage in regular exercise appropriate for her age and condition. Kaitlin Bowman was seen today for annual exam.  Diagnoses and all orders for this visit:  Routine physical examination  Hyperlipidemia associated with type 2 diabetes mellitus (HCC) -     metFORMIN  (GLUCOPHAGE -XR) 500 MG 24 hr tablet; Take 1 tablet (500 mg total) by mouth 2 (two) times daily. -     Amb ref to Medical Nutrition Therapy-MNT  Hyperlipidemia LDL goal <70  Hypertension goal BP (blood pressure) < 130/80  Arthralgia, unspecified joint  Type 2 diabetes mellitus without complication, without long-term current use of insulin (HCC)  Pulsatile tinnitus of right ear -     CT ANGIO HEAD W OR WO CONTRAST; Future -     CT ANGIO NECK W OR WO CONTRAST; Future  Peri-menopausal  Vertigo -     meclizine  (ANTIVERT ) 25 MG tablet; Take 1 tablet (25 mg total) by mouth 3 (three) times daily as needed for dizziness.    Assessment & Plan Adult Wellness Visit Blood pressure readings indicate prehypertension. Thyroid  function and microalbumin levels are normal. A1c at 6.8%. Elevated cholesterol possibly due to inconsistent rosuvastatin  use. She is due for a Pap smear and pneumonia vaccine. - Continue current management for prehypertension. - Continue metformin  for diabetes management. - Encouraged consistent  use of rosuvastatin  twice a week. - discussed pap smear, pt declined today but will consider in future - Recommended pneumonia vaccine. - Encouraged regular exercise and healthy diet.  Type 2 diabetes mellitus A1c at 6.8%. Possible contributing factors include dietary habits and menopause-related metabolic changes. - Continue metformin . - Encouraged dietary modifications to reduce sugar and carbohydrate intake. - Monitor blood glucose levels at home. - referral placed for nutritionist.   Hyperlipidemia Cholesterol levels elevated, possibly due to inconsistent rosuvastatin  use. Reports shoulder pain possibly related to rosuvastatin . - Encouraged consistent use of rosuvastatin  twice a week. - Will monitor cholesterol levels in 3-6 months.  Peri-menopausal Absence of menstrual period for 3-4 months indicates peri-menopausal, 1 year without period is menopause. Possible metabolic changes may affect diabetes management. - Continue to monitor for further menopausal symptoms and their impact on health.  Arthralgia and possible arthritis Reports stiffness and swelling in hands, possibly indicating arthritis. Symptoms may be exacerbated by cold weather and dietary factors. - Ordered inflammatory lab tests with labs that were done a few days ago.  - Recommended turmeric 500 mg twice a day. - Encouraged anti-inflammatory diet low in sugars. - Suggested Tylenol  arthritis as needed for pain management.  Sleep disturbance Reports frequent waking at night, possibly related to menopause. - Recommended natural sleep aids such as magnesium, ashwagandha, lavender, valerian root, and chamomile tea.  Pulsatile tinnitus, right  side Intermittent pulsatile tinnitus on the right side. - Ordered CT angiogram of the head/neck to evaluate vasculature.     Return in about 3 months (around 09/17/2024).     Tannis Burstein, PA-C

## 2024-06-17 NOTE — Patient Instructions (Addendum)
 For SLeep:  Valarian root/magnesium/lavendar/ashwaganda  For joint pain:  Tumeric 500mg  twice a day Tylenol  arthritis Glucosamine chondroitin  Will order CTA of neck    Health Maintenance, Female Adopting a healthy lifestyle and getting preventive care are important in promoting health and wellness. Ask your health care provider about: The right schedule for you to have regular tests and exams. Things you can do on your own to prevent diseases and keep yourself healthy. What should I know about diet, weight, and exercise? Eat a healthy diet  Eat a diet that includes plenty of vegetables, fruits, low-fat dairy products, and lean protein. Do not eat a lot of foods that are high in solid fats, added sugars, or sodium. Maintain a healthy weight Body mass index (BMI) is used to identify weight problems. It estimates body fat based on height and weight. Your health care provider can help determine your BMI and help you achieve or maintain a healthy weight. Get regular exercise Get regular exercise. This is one of the most important things you can do for your health. Most adults should: Exercise for at least 150 minutes each week. The exercise should increase your heart rate and make you sweat (moderate-intensity exercise). Do strengthening exercises at least twice a week. This is in addition to the moderate-intensity exercise. Spend less time sitting. Even light physical activity can be beneficial. Watch cholesterol and blood lipids Have your blood tested for lipids and cholesterol at 54 years of age, then have this test every 5 years. Have your cholesterol levels checked more often if: Your lipid or cholesterol levels are high. You are older than 54 years of age. You are at high risk for heart disease. What should I know about cancer screening? Depending on your health history and family history, you may need to have cancer screening at various ages. This may include screening  for: Breast cancer. Cervical cancer. Colorectal cancer. Skin cancer. Lung cancer. What should I know about heart disease, diabetes, and high blood pressure? Blood pressure and heart disease High blood pressure causes heart disease and increases the risk of stroke. This is more likely to develop in people who have high blood pressure readings or are overweight. Have your blood pressure checked: Every 3-5 years if you are 44-52 years of age. Every year if you are 53 years old or older. Diabetes Have regular diabetes screenings. This checks your fasting blood sugar level. Have the screening done: Once every three years after age 83 if you are at a normal weight and have a low risk for diabetes. More often and at a younger age if you are overweight or have a high risk for diabetes. What should I know about preventing infection? Hepatitis B If you have a higher risk for hepatitis B, you should be screened for this virus. Talk with your health care provider to find out if you are at risk for hepatitis B infection. Hepatitis C Testing is recommended for: Everyone born from [birth year] through 1966. Anyone with known risk factors for hepatitis C. Sexually transmitted infections (STIs) Get screened for STIs, including gonorrhea and chlamydia, if: You are sexually active and are younger than 54 years of age. You are older than 54 years of age and your health care provider tells you that you are at risk for this type of infection. Your sexual activity has changed since you were last screened, and you are at increased risk for chlamydia or gonorrhea. Ask your health care provider if you are at  risk. Ask your health care provider about whether you are at high risk for HIV. Your health care provider may recommend a prescription medicine to help prevent HIV infection. If you choose to take medicine to prevent HIV, you should first get tested for HIV. You should then be tested every 3 months for as long as you  are taking the medicine. Pregnancy If you are about to stop having your period (premenopausal) and you may become pregnant, seek counseling before you get pregnant. Take 400 to 800 micrograms (mcg) of folic acid every day if you become pregnant. Ask for birth control (contraception) if you want to prevent pregnancy. Osteoporosis and menopause Osteoporosis is a disease in which the bones lose minerals and strength with aging. This can result in bone fractures. If you are 70 years old or older, or if you are at risk for osteoporosis and fractures, ask your health care provider if you should: Be screened for bone loss. Take a calcium  or vitamin D  supplement to lower your risk of fractures. Be given hormone replacement therapy (HRT) to treat symptoms of menopause. Follow these instructions at home: Alcohol use Do not drink alcohol if: Your health care provider tells you not to drink. You are pregnant, may be pregnant, or are planning to become pregnant. If you drink alcohol: Limit how much you have to: 0-1 drink a day. Know how much alcohol is in your drink. In the [place] one drink equals one 12 oz bottle of beer (355 mL), one 5 oz glass of wine (148 mL), or one 1 oz glass of hard liquor (44 mL). Lifestyle Do not use any products that contain nicotine or tobacco. These products include cigarettes, chewing tobacco, and vaping devices, such as e-cigarettes. If you need help quitting, ask your health care provider. Do not use street drugs. Do not share needles. Ask your health care provider for help if you need support or information about quitting drugs. General instructions Schedule regular health, dental, and eye exams. Stay current with your vaccines. Tell your health care provider if: You often feel depressed. You have ever been abused or do not feel safe at home. Summary Adopting a healthy lifestyle and getting preventive care are important in promoting health and wellness. Follow your  health care provider's instructions about healthy diet, exercising, and getting tested or screened for diseases. Follow your health care provider's instructions on monitoring your cholesterol and blood pressure. This information is not intended to replace advice given to you by your health care provider. Make sure you discuss any questions you have with your health care provider. Document Revised: 12/24/2020 Document Reviewed: 12/24/2020 Elsevier Patient Education  2024 [employer].

## 2024-06-20 ENCOUNTER — Encounter: Payer: Self-pay | Admitting: Physician Assistant

## 2024-06-20 DIAGNOSIS — N951 Menopausal and female climacteric states: Secondary | ICD-10-CM | POA: Insufficient documentation

## 2024-06-22 ENCOUNTER — Ambulatory Visit: Payer: Self-pay | Admitting: Physician Assistant

## 2024-06-22 LAB — SPECIMEN STATUS REPORT

## 2024-06-22 LAB — ANTINUCLEAR ANTIBODIES, IFA

## 2024-06-22 LAB — SEDIMENTATION RATE

## 2024-06-22 LAB — HIGH SENSITIVITY CRP: CRP, High Sensitivity: 0.3 mg/L (ref 0.00–3.00)

## 2024-06-22 NOTE — Progress Notes (Signed)
 ANA is negative. Inflammatory marker is normal and low risk!

## 2024-07-11 NOTE — Progress Notes (Signed)
 Diabetes Self-Management Education  Visit Type: First/Initial  Appt. Start Time: 1050 Appt. End Time: 1152  07/18/2024  [name], identified by name and date of birth, is a 53 y.o. female with a diagnosis of Diabetes: Type 2.   ASSESSMENT Patient is here today alone. Patient would like to learn more about diabetes diet. Patient lives with her sister and reports shared shopping and cooking. Pt reports eating out five times yearly. Pt reports making the following changes including increasing physical activity for the past two months. Pt reports poor sleep of 6 hours nightly and c/o day time with day sleepiness and hears compliant of snoring from family.    All Pt's questions were answered during this encounter.   History includes:   Past Medical History:  Diagnosis Date   Endometrial polyp    Environmental allergies    Goiter with hyperthyroidism 2009   (07-29-2022 per pt had taken medication for 2-3 wks and labs ok , pcp continues to monitor, no meds)   History of positive PPD 2015   per pt was false positive ,  blood test was negative   Hyperlipidemia    Hypertension    followed by pcp--- per pt monitoring at home, no med   IDA (iron deficiency anemia)    Intermittent palpitations    per pt once in a while   Menorrhagia    Type 2 diabetes mellitus (HCC)    followed by pcp  (07-29-2022  per pt checks blood sugar once a month)   Vertigo    Wears glasses     Medications include:   Current Outpatient Medications:    ascorbic acid (VITAMIN C) 500 MG tablet, Take 500 mg by mouth daily., Disp: , Rfl:    aspirin-acetaminophen -caffeine (EXCEDRIN MIGRAINE) 250-250-65 MG tablet, Take 1 tablet by mouth every 6 (six) hours as needed (for migraines). , Disp: , Rfl:    ferrous sulfate  325 (65 FE) MG EC tablet, Take 1 tablet (325 mg total) by mouth 2 (two) times daily with a meal. During menstrual cycles, Disp: 60 tablet, Rfl: 2   meclizine  (ANTIVERT ) 25 MG tablet, Take 1 tablet (25  mg total) by mouth 3 (three) times daily as needed for dizziness., Disp: 90 tablet, Rfl: 0   metFORMIN  (GLUCOPHAGE -XR) 500 MG 24 hr tablet, Take 1 tablet (500 mg total) by mouth 2 (two) times daily., Disp: 180 tablet, Rfl: 1   Multiple Vitamins-Minerals (MULTIVITAMIN ADULTS PO), Take by mouth daily. CENTRUM SILVER, Disp: , Rfl:    AMBULATORY NON FORMULARY MEDICATION, Freestyle lite  test strips and lancets for T2DM to test three times a day., Disp: 100 each, Rfl: 3   diclofenac  Sodium (VOLTAREN ) 1 % GEL, Apply 4 g topically 4 (four) times daily. To affected joint., Disp: 100 g, Rfl: 1   glucose blood (FREESTYLE LITE) test strip, Test 3 times a day, Disp: 100 each, Rfl: 3   hydroxypropyl methylcellulose / hypromellose (ISOPTO TEARS / GONIOVISC) 2.5 % ophthalmic solution, Place 1 drop into both eyes 4 (four) times daily as needed for dry eyes., Disp: , Rfl:    Lancets (FREESTYLE) lancets, Test 3 (three) times daily., Disp: 100 each, Rfl: 3   REFRESH OPTIVE MEGA-3 0.5-1-0.5 % SOLN, place 1 drop into affected eye as needed for dryness, Disp: 60 each, Rfl: 4   rosuvastatin  (CRESTOR ) 10 MG tablet, Take 1 tablet (10 mg total) by mouth 2 (two) times a week., Disp: 24 tablet, Rfl: 1   triamcinolone cream (  KENALOG) 0.5 %, Apply 1 Application topically as needed., Disp: , Rfl:    White Petrolatum -Mineral Oil (ARTIFICIAL TEARS) ointment, apply to affected eye every evening if needed for  dryness, Disp: 3.5 g, Rfl: 4  Labs noted:   Lab Results  Component Value Date   HGBA1C 6.8 (H) 06/14/2024   Lab Results  Component Value Date   CHOL 212 (H) 06/14/2024   HDL 79 06/14/2024   LDLCALC 118 (H) 06/14/2024   TRIG 86 06/14/2024   CHOLHDL 2.7 06/14/2024   Wt Readings from Last 3 Encounters:  06/17/24 136 lb (61.7 kg)  02/23/24 132 lb (59.9 kg)  09/18/23 135 lb (61.2 kg)   There were no vitals taken for this visit. There is no height or weight on file to calculate BMI.   Diabetes Self-Management  Education - 07/18/24 1102       Visit Information   Visit Type First/Initial      Initial Visit   Diabetes Type Type 2    Are you currently following a meal plan? No    Are you taking your medications as prescribed? Yes      Health Coping   How would you rate your overall health? Fair      Psychosocial Assessment   Patient Belief/Attitude about Diabetes Motivated to manage diabetes    What is the hardest part about your diabetes right now, causing you the most concern, or is the most worrisome to you about your diabetes?   Making healty food and beverage choices    Self-care barriers None    Self-management support Doctor's office    Other persons present Patient    Patient Concerns Nutrition/Meal planning;Problem Solving    Special Needs None    Preferred Learning Style No preference indicated    Learning Readiness Change in progress    How often do you need to have someone help you when you read instructions, pamphlets, or other written materials from your doctor or pharmacy? 1 - Never    What is the last grade level you completed in school? bachelors      Pre-Education Assessment   Patient understands the diabetes disease and treatment process. Needs Instruction    Patient understands incorporating nutritional management into lifestyle. Needs Instruction    Patient undertands incorporating physical activity into lifestyle. Comprehends key points    Patient understands using medications safely. Comprehends key points    Patient understands monitoring blood glucose, interpreting and using results Needs Instruction    Patient understands prevention, detection, and treatment of acute complications. Needs Instruction    Patient understands prevention, detection, and treatment of chronic complications. Needs Instruction    Patient understands how to develop strategies to address psychosocial issues. Needs Instruction    Patient understands how to develop strategies to promote  health/change behavior. Needs Instruction      Complications   Last HgB A1C per patient/outside source 6.8 %    How often do you check your blood sugar? 0 times/day (not testing)    Postprandial Blood glucose range (mg/dL) 869-820    Number of hypoglycemic episodes per month 0    Number of hyperglycemic episodes ( >200mg /dL): Never    Have you had a dilated eye exam in the past 12 months? Yes    Have you had a dental exam in the past 12 months? Yes    Are you checking your feet? Yes    How many days per week are you checking your feet? 7  Dietary Intake   Breakfast cereal cheerios, 2%, banana, decafe coffee with 2% milk    Snack (morning) banana    Lunch ramen with egg or grilled cheese or leftovers, decafe coffee with 2% milk    Snack (afternoon) 2 handlfuls of peanuts    Dinner rice, green beans, carrots, cucumbers, egg or beef or turkey    Snack (evening) peanuts or chips or 2 cups of rice crackers    Beverage(s) coffee with milk, corn tea, sparkling water, body armour, 2% milk, de cafe coffee with 2% milk      Activity / Exercise   Activity / Exercise Type Moderate (swimming / aerobic walking)    How many days per week do you exercise? 4    How many minutes per day do you exercise? 60    Total minutes per week of exercise 240      Patient Education   Previous Diabetes Education Yes    Disease Pathophysiology Definition of diabetes, type 1 and 2, and the diagnosis of diabetes    Healthy Eating Plate Method;Carbohydrate counting;Role of diet in the treatment of diabetes and the relationship between the three main macronutrients and blood glucose level;Reviewed blood glucose goals for pre and post meals and how to evaluate the patients' food intake on their blood glucose level.    Being Active Role of exercise on diabetes management, blood pressure control and cardiac health.    Medications Reviewed patients medication for diabetes, action, purpose, timing of dose and side  effects.    Monitoring Yearly dilated eye exam;Daily foot exams;Identified appropriate SMBG and/or A1C goals.    Chronic complications Relationship between chronic complications and blood glucose control    Diabetes Stress and Support Identified and addressed patients feelings and concerns about diabetes;Role of stress on diabetes;Worked with patient to identify barriers to care and solutions    Preconception care --   n/a   Lifestyle and Health Coping Lifestyle issues that need to be addressed for better diabetes care      Individualized Goals (developed by patient)   Nutrition Follow meal plan discussed    Physical Activity Exercise 5-7 days per week;60 minutes per day    Medications take my medication as prescribed    Monitoring  Test my blood glucose as discussed    Problem Solving Sleep Pattern;Addressing barriers to behavior change;Eating Pattern    Reducing Risk examine blood glucose patterns;do foot checks daily    Health Coping Ask for help with psychological, social, or emotional issues      Post-Education Assessment   Patient understands the diabetes disease and treatment process. Needs Review    Patient understands incorporating nutritional management into lifestyle. Needs Review    Patient undertands incorporating physical activity into lifestyle. Comprehends key points    Patient understands using medications safely. Comphrehends key points    Patient understands monitoring blood glucose, interpreting and using results Needs Review    Patient understands prevention, detection, and treatment of acute complications. Needs Review    Patient understands prevention, detection, and treatment of chronic complications. Needs Review    Patient understands how to develop strategies to address psychosocial issues. Needs Review    Patient understands how to develop strategies to promote health/change behavior. Needs Review      Outcomes   Expected Outcomes Demonstrated interest in  learning. Expect positive outcomes    Program Status Not Completed          Individualized Plan for Diabetes Self-Management Training:  Learning Objective:  Patient will have a greater understanding of diabetes self-management. Patient education plan is to attend individual and/or group sessions per assessed needs and concerns.   Plan:   There are no Patient Instructions on file for this visit.  Expected Outcomes:  Demonstrated interest in learning. Expect positive outcomes  Education material provided: ADA - How to Thrive: A Guide for Your Journey with Diabetes, My Plate, Snack sheet, and Diabetes Resources  If problems or questions, patient to contact team via:  Phone  Future DSME appointment:

## 2024-07-18 ENCOUNTER — Encounter: Admitting: Dietician

## 2024-07-18 DIAGNOSIS — E1169 Type 2 diabetes mellitus with other specified complication: Secondary | ICD-10-CM

## 2024-07-18 NOTE — Patient Instructions (Addendum)
 Great job with physical activity! Keep it up!  Test blood sugar 2 hours after a meal three times weekly!

## 2024-07-25 NOTE — Telephone Encounter (Signed)
 Can you look into this?

## 2024-08-01 NOTE — Progress Notes (Unsigned)
 [id number] min  [place] visit 2   Diabetes Self-Management Education  Visit Type:    Appt. Start Time: *** Appt. End Time: ***  08/01/2024  [name], identified by name and date of birth, is a 53 y.o. female with a diagnosis of Diabetes:  .   ASSESSMENT Patient is here today alone. Patient would like to learn more about diabetes diet. Patient lives with her sister and reports shared shopping and cooking. Pt reports eating out five times yearly. Pt reports making the following changes including increasing physical activity for the past two months. Pt reports poor sleep of 6 hours nightly and c/o day time with day sleepiness and hears compliant of snoring from family.    All Pt's questions were answered during this encounter.   History includes:   Past Medical History:  Diagnosis Date   Endometrial polyp    Environmental allergies    Goiter with hyperthyroidism 2009   (07-29-2022 per pt had taken medication for 2-3 wks and labs ok , pcp continues to monitor, no meds)   History of positive PPD 2015   per pt was false positive ,  blood test was negative   Hyperlipidemia    Hypertension    followed by pcp--- per pt monitoring at home, no med   IDA (iron deficiency anemia)    Intermittent palpitations    per pt once in a while   Menorrhagia    Type 2 diabetes mellitus (HCC)    followed by pcp  (07-29-2022  per pt checks blood sugar once a month)   Vertigo    Wears glasses     Medications include:   Current Outpatient Medications:    AMBULATORY NON FORMULARY MEDICATION, Freestyle lite  test strips and lancets for T2DM to test three times a day., Disp: 100 each, Rfl: 3   ascorbic acid (VITAMIN C) 500 MG tablet, Take 500 mg by mouth daily., Disp: , Rfl:    aspirin-acetaminophen -caffeine (EXCEDRIN MIGRAINE) 250-250-65 MG tablet, Take 1 tablet by mouth every 6 (six) hours as needed (for migraines). , Disp: , Rfl:    diclofenac  Sodium (VOLTAREN ) 1 % GEL, Apply 4 g  topically 4 (four) times daily. To affected joint., Disp: 100 g, Rfl: 1   ferrous sulfate  325 (65 FE) MG EC tablet, Take 1 tablet (325 mg total) by mouth 2 (two) times daily with a meal. During menstrual cycles, Disp: 60 tablet, Rfl: 2   glucose blood (FREESTYLE LITE) test strip, Test 3 times a day, Disp: 100 each, Rfl: 3   hydroxypropyl methylcellulose / hypromellose (ISOPTO TEARS / GONIOVISC) 2.5 % ophthalmic solution, Place 1 drop into both eyes 4 (four) times daily as needed for dry eyes., Disp: , Rfl:    Lancets (FREESTYLE) lancets, Test 3 (three) times daily., Disp: 100 each, Rfl: 3   meclizine  (ANTIVERT ) 25 MG tablet, Take 1 tablet (25 mg total) by mouth 3 (three) times daily as needed for dizziness., Disp: 90 tablet, Rfl: 0   metFORMIN  (GLUCOPHAGE -XR) 500 MG 24 hr tablet, Take 1 tablet (500 mg total) by mouth 2 (two) times daily., Disp: 180 tablet, Rfl: 1   Multiple Vitamins-Minerals (MULTIVITAMIN ADULTS PO), Take by mouth daily. CENTRUM SILVER, Disp: , Rfl:    REFRESH OPTIVE MEGA-3 0.5-1-0.5 % SOLN, place 1 drop into affected eye as needed for dryness, Disp: 60 each, Rfl: 4   rosuvastatin  (CRESTOR ) 10 MG tablet, Take 1 tablet (10 mg total) by mouth 2 (two) times a week.,  Disp: 24 tablet, Rfl: 1   triamcinolone cream (KENALOG) 0.5 %, Apply 1 Application topically as needed., Disp: , Rfl:    White Petrolatum -Mineral Oil (ARTIFICIAL TEARS) ointment, apply to affected eye every evening if needed for  dryness, Disp: 3.5 g, Rfl: 4  Labs noted:   Lab Results  Component Value Date   HGBA1C 6.8 (H) 06/14/2024   Lab Results  Component Value Date   CHOL 212 (H) 06/14/2024   HDL 79 06/14/2024   LDLCALC 118 (H) 06/14/2024   TRIG 86 06/14/2024   CHOLHDL 2.7 06/14/2024   Wt Readings from Last 3 Encounters:  06/17/24 136 lb (61.7 kg)  02/23/24 132 lb (59.9 kg)  09/18/23 135 lb (61.2 kg)   There were no vitals taken for this visit. There is no height or weight on file to calculate  BMI.     Individualized Plan for Diabetes Self-Management Training:   Learning Objective:  Patient will have a greater understanding of diabetes self-management. Patient education plan is to attend individual and/or group sessions per assessed needs and concerns.   Plan:   There are no Patient Instructions on file for this visit.  Expected Outcomes:     Education material provided: ADA - How to Thrive: A Guide for Your Journey with Diabetes, My Plate, Snack sheet, and Diabetes Resources  If problems or questions, patient to contact team via:  Phone  Future DSME appointment:

## 2024-08-08 ENCOUNTER — Encounter: Admitting: Dietician

## 2024-08-08 NOTE — Patient Instructions (Signed)
 Monitor blood sugar 2 hour after meals  Great job with walking!

## 2024-09-16 ENCOUNTER — Ambulatory Visit: Admitting: Physician Assistant

## 2024-09-20 ENCOUNTER — Other Ambulatory Visit (HOSPITAL_COMMUNITY): Payer: Self-pay

## 2024-09-30 ENCOUNTER — Ambulatory Visit: Admitting: Physician Assistant

## 2024-10-10 ENCOUNTER — Ambulatory Visit: Admitting: Physician Assistant

## 2024-11-22 ENCOUNTER — Encounter: Admitting: Dietician
